# Patient Record
Sex: Female | Born: 1965 | Race: Black or African American | Hispanic: No | Smoking: Never smoker
Health system: Southern US, Community
[De-identification: ages and names within clinical notes are randomized; demographics above are authoritative.]

## PROBLEM LIST (undated history)

## (undated) DIAGNOSIS — I10 Essential (primary) hypertension: Secondary | ICD-10-CM

## (undated) HISTORY — PX: EYE SURGERY: SHX253

## (undated) HISTORY — PX: BREAST SURGERY: SHX581

## (undated) HISTORY — PX: ABLATION: SHX5711

---

## 2001-10-05 ENCOUNTER — Other Ambulatory Visit: Admission: RE | Admit: 2001-10-05 | Discharge: 2001-10-05 | Payer: Self-pay | Admitting: Obstetrics and Gynecology

## 2002-08-29 ENCOUNTER — Emergency Department (HOSPITAL_COMMUNITY): Admission: EM | Admit: 2002-08-29 | Discharge: 2002-08-29 | Payer: Self-pay | Admitting: *Deleted

## 2002-09-20 ENCOUNTER — Emergency Department (HOSPITAL_COMMUNITY): Admission: EM | Admit: 2002-09-20 | Discharge: 2002-09-20 | Payer: Self-pay | Admitting: Emergency Medicine

## 2002-09-20 ENCOUNTER — Ambulatory Visit (HOSPITAL_COMMUNITY): Admission: AD | Admit: 2002-09-20 | Discharge: 2002-09-20 | Payer: Self-pay | Admitting: Internal Medicine

## 2002-09-20 ENCOUNTER — Encounter: Payer: Self-pay | Admitting: Emergency Medicine

## 2002-10-18 ENCOUNTER — Ambulatory Visit (HOSPITAL_COMMUNITY): Admission: AD | Admit: 2002-10-18 | Discharge: 2002-10-18 | Payer: Self-pay | Admitting: Obstetrics and Gynecology

## 2002-11-03 ENCOUNTER — Inpatient Hospital Stay (HOSPITAL_COMMUNITY): Admission: AD | Admit: 2002-11-03 | Discharge: 2002-11-05 | Payer: Self-pay | Admitting: Obstetrics and Gynecology

## 2004-02-08 ENCOUNTER — Ambulatory Visit (HOSPITAL_COMMUNITY): Admission: RE | Admit: 2004-02-08 | Discharge: 2004-02-08 | Payer: Self-pay | Admitting: Family Medicine

## 2004-04-21 ENCOUNTER — Emergency Department (HOSPITAL_COMMUNITY): Admission: EM | Admit: 2004-04-21 | Discharge: 2004-04-22 | Payer: Self-pay | Admitting: Emergency Medicine

## 2004-05-04 ENCOUNTER — Ambulatory Visit (HOSPITAL_BASED_OUTPATIENT_CLINIC_OR_DEPARTMENT_OTHER): Admission: RE | Admit: 2004-05-04 | Discharge: 2004-05-04 | Payer: Self-pay | Admitting: General Surgery

## 2004-05-04 ENCOUNTER — Encounter: Admission: RE | Admit: 2004-05-04 | Discharge: 2004-05-04 | Payer: Self-pay | Admitting: General Surgery

## 2004-05-04 ENCOUNTER — Ambulatory Visit (HOSPITAL_COMMUNITY): Admission: RE | Admit: 2004-05-04 | Discharge: 2004-05-04 | Payer: Self-pay | Admitting: General Surgery

## 2004-05-04 ENCOUNTER — Encounter (INDEPENDENT_AMBULATORY_CARE_PROVIDER_SITE_OTHER): Payer: Self-pay | Admitting: Specialist

## 2005-10-18 ENCOUNTER — Ambulatory Visit (HOSPITAL_COMMUNITY): Admission: RE | Admit: 2005-10-18 | Discharge: 2005-10-18 | Payer: Self-pay | Admitting: Obstetrics and Gynecology

## 2005-10-21 ENCOUNTER — Emergency Department (HOSPITAL_COMMUNITY): Admission: EM | Admit: 2005-10-21 | Discharge: 2005-10-21 | Payer: Self-pay | Admitting: Emergency Medicine

## 2005-12-16 ENCOUNTER — Encounter: Payer: Self-pay | Admitting: Obstetrics and Gynecology

## 2005-12-16 ENCOUNTER — Ambulatory Visit (HOSPITAL_COMMUNITY): Admission: RE | Admit: 2005-12-16 | Discharge: 2005-12-16 | Payer: Self-pay | Admitting: Obstetrics and Gynecology

## 2006-03-20 ENCOUNTER — Ambulatory Visit (HOSPITAL_COMMUNITY): Admission: RE | Admit: 2006-03-20 | Discharge: 2006-03-20 | Payer: Self-pay | Admitting: Family Medicine

## 2007-01-07 ENCOUNTER — Ambulatory Visit (HOSPITAL_COMMUNITY): Admission: RE | Admit: 2007-01-07 | Discharge: 2007-01-07 | Payer: Self-pay | Admitting: Family Medicine

## 2007-03-09 ENCOUNTER — Ambulatory Visit (HOSPITAL_COMMUNITY): Admission: RE | Admit: 2007-03-09 | Discharge: 2007-03-09 | Payer: Self-pay | Admitting: Ophthalmology

## 2007-07-25 ENCOUNTER — Emergency Department (HOSPITAL_COMMUNITY): Admission: EM | Admit: 2007-07-25 | Discharge: 2007-07-25 | Payer: Self-pay | Admitting: Emergency Medicine

## 2008-06-17 ENCOUNTER — Other Ambulatory Visit: Admission: RE | Admit: 2008-06-17 | Discharge: 2008-06-17 | Payer: Self-pay | Admitting: Obstetrics and Gynecology

## 2009-05-05 ENCOUNTER — Ambulatory Visit (HOSPITAL_COMMUNITY): Admission: RE | Admit: 2009-05-05 | Discharge: 2009-05-05 | Payer: Self-pay | Admitting: Obstetrics and Gynecology

## 2009-05-16 ENCOUNTER — Encounter: Admission: RE | Admit: 2009-05-16 | Discharge: 2009-05-16 | Payer: Self-pay | Admitting: Obstetrics and Gynecology

## 2010-04-11 ENCOUNTER — Emergency Department (HOSPITAL_COMMUNITY): Admission: EM | Admit: 2010-04-11 | Discharge: 2010-04-11 | Payer: Self-pay | Admitting: Emergency Medicine

## 2010-07-13 ENCOUNTER — Encounter: Admission: RE | Admit: 2010-07-13 | Discharge: 2010-07-13 | Payer: Self-pay | Admitting: Obstetrics and Gynecology

## 2010-10-07 ENCOUNTER — Emergency Department (HOSPITAL_COMMUNITY)
Admission: EM | Admit: 2010-10-07 | Discharge: 2010-10-07 | Payer: Self-pay | Source: Home / Self Care | Admitting: Family Medicine

## 2010-11-24 ENCOUNTER — Emergency Department (HOSPITAL_COMMUNITY)
Admission: EM | Admit: 2010-11-24 | Discharge: 2010-11-24 | Payer: Self-pay | Source: Home / Self Care | Admitting: Emergency Medicine

## 2010-11-25 ENCOUNTER — Encounter: Payer: Self-pay | Admitting: Family Medicine

## 2011-01-21 LAB — CBC
HCT: 34.9 % — ABNORMAL LOW (ref 36.0–46.0)
Hemoglobin: 11.4 g/dL — ABNORMAL LOW (ref 12.0–15.0)
MCV: 85.8 fL (ref 78.0–100.0)
Platelets: 326 10*3/uL (ref 150–400)
RBC: 4.07 MIL/uL (ref 3.87–5.11)
RDW: 14.1 % (ref 11.5–15.5)
WBC: 12.5 10*3/uL — ABNORMAL HIGH (ref 4.0–10.5)

## 2011-01-21 LAB — DIFFERENTIAL
Lymphocytes Relative: 25 % (ref 12–46)
Monocytes Relative: 3 % (ref 3–12)
Neutro Abs: 8.9 10*3/uL — ABNORMAL HIGH (ref 1.7–7.7)
Neutrophils Relative %: 71 % (ref 43–77)

## 2011-01-21 LAB — BASIC METABOLIC PANEL
Creatinine, Ser: 0.77 mg/dL (ref 0.4–1.2)
Sodium: 138 mEq/L (ref 135–145)

## 2011-02-12 ENCOUNTER — Other Ambulatory Visit: Payer: Self-pay | Admitting: Obstetrics and Gynecology

## 2011-02-12 DIAGNOSIS — Z09 Encounter for follow-up examination after completed treatment for conditions other than malignant neoplasm: Secondary | ICD-10-CM

## 2011-02-22 ENCOUNTER — Ambulatory Visit
Admission: RE | Admit: 2011-02-22 | Discharge: 2011-02-22 | Disposition: A | Discharge status: Home / Self Care | Payer: BC Managed Care – PPO | Source: Ambulatory Visit | Attending: Obstetrics and Gynecology | Admitting: Obstetrics and Gynecology

## 2011-02-22 ENCOUNTER — Other Ambulatory Visit: Payer: Self-pay | Admitting: Obstetrics and Gynecology

## 2011-02-22 DIAGNOSIS — Z09 Encounter for follow-up examination after completed treatment for conditions other than malignant neoplasm: Secondary | ICD-10-CM

## 2011-03-22 NOTE — Op Note (Signed)
   [name], [name]                          ACCOUNT NO.:  [id number]   MEDICAL RECORD NO.:  [id number]                   PATIENT TYPE:  INP   LOCATION:  A428                                 FACILITY:  [facility]   PHYSICIAN:  Tilda Burrow, M.D.              DATE OF BIRTH:  1966-02-10   DATE OF PROCEDURE:  11/03/2002  DATE OF DISCHARGE:                                  PROCEDURE NOTE   ONSET OF LABOR:  November 03, 2002, at 5 a.m.   DATE OF DELIVERY:  November 03, 2002, at 1745 p.m.   LENGTH OF FIRST STAGE OF LABOR:  12 hours and 30 minutes.   LENGTH OF SECOND STAGE OF LABOR:  50 minutes.   LENGTH OF THIRD STAGE OF LABOR:  5 minutes.   DELIVERY NOTE:  The patient had a normal spontaneous vaginal delivery of a  viable female infant.  Upon delivery of the head, the nose and mouth were  suctioned.  The shoulders rapidly followed out without any difficulty.  Upon  delivery of the infant, the infant was active, alert, had good movement of  all extremities, strong cry, and pinked up well.  The infant was dried.  The  cord was clamped and cut.  The infant was placed on the mother's abdomen in  good condition.  Upon inspection, a three-vessel coronary disease was noted.  The perineum had a 1 degree laceration which required no suturing due to  good hemostasis.  The third stage of labor was actively managed with 20 cc  of Pitocin in 1000 cc of D5 LR at a rapid rate.  The placenta was delivered  spontaneously via Schultz's mechanism.  Upon inspection, the membranes were  noted to be intact.  The estimated blood loss was approximately 400 cc.  The  patient was given 1/2 of an ampule of Hemabate to promote fundal firmness  and hemostasis.     Zerita Boers, Reita Cliche, M.D.    DL/MEDQ  D:  24/40/1027  T:  11/03/2002  Job:  [id number]   cc:   [facility]

## 2011-03-22 NOTE — Op Note (Signed)
NAMEDERRIKA, RUFFALO              ACCOUNT NO.:  [id number]   MEDICAL RECORD NO.:  [id number]          PATIENT TYPE:  AMB   LOCATION:  DAY                           FACILITY:  [facility]   PHYSICIAN:  Tilda Burrow, M.D. DATE OF BIRTH:  09/30/1966   DATE OF PROCEDURE:  12/16/2005  DATE OF DISCHARGE:                                 OPERATIVE REPORT   PREOPERATIVE DIAGNOSES:  1.  Menorrhagia.  2.  Uterine fibroids.   POSTOPERATIVE DIAGNOSES:  1.  Menorrhagia.  2.  Uterine fibroids.   PROCEDURE:  Hysteroscopy, dilation and curettage, endometrial ablation.   SURGEON:  Tilda Burrow, M.D.   ASSISTANTAlinda Money, R.N.   ANESTHESIA:  General.   COMPLICATIONS:  None.   FINDINGS:  Uterus sounding to 11 cm preprocedure.   DETAILS OF PROCEDURE:  The patient was taken to the operating room for  vaginal procedure with speculum in place. Cervix was grasped with a single-  toothed tenaculum utilized and the uterine contours delineated. Hysteroscopy  showed a smooth internal cavity with some loose debris. Smooth, sharp  curettage removed the debris, and then we were able to [facility] ThermaChoice  endometrial ablation without worry. The patient tolerated the procedure  well.   ADDENDUM:  Paracervical block with Marcaine and _epinephrine___ was  completed.      Tilda Burrow, M.D.  Electronically Signed     JVF/MEDQ  D:  12/16/2005  T:  12/17/2005  Job:  [id number]

## 2011-03-22 NOTE — H&P (Signed)
[name], [name]              ACCOUNT NO.:  [id number]   MEDICAL RECORD NO.:  [id number]          PATIENT TYPE:  AMB   LOCATION:  DAY                           FACILITY:  [facility]   PHYSICIAN:  Tilda Burrow, M.D. DATE OF BIRTH:  05/19/1966   DATE OF ADMISSION:  DATE OF DISCHARGE:  LH                                HISTORY & PHYSICAL   ADMISSION DIAGNOSES:  1.  Menorrhagia.  2.  Uterine fibroids.  3.  Dysmenorrhea.   HISTORY:  This 45 year old large-framed African-American female was admitted  for hysteroscopy D&C and endometrial ablation. Her last menstrual period was  December 06, 2005, so she is on Day 10 of cycle. She has had a recent upper  respiratory infection. This is resolving as of December 09, 2005 office  visit. The patient has had a recent ultrasound which measured 11.5 x 7.5 x  8.2 cm uterine diameters with thickened endometrial stripe when she was in  gluteal phase. She had multiple small fibroids all less than 2 cm. Right  ovary was normal. Left ovary was normal. She is admitted at this time for  endometrial ablation, having seen both hysterectomy and endometrial ablation  educational pamphlets and been given the HTA endometrial ablation video  tape.   PAST MEDICAL HISTORY:  Positive for obesity and pregnancy-related  hypertension.   MEDICATIONS:  None at present.   PAST SURGICAL HISTORY:  1.  Tonsillectomy.  2.  Breast biopsy x2.   ALLERGIES:  None.   PHYSICAL EXAMINATION:  VITAL SIGNS:  Weight 275, blood pressure 118/60.  HEENT:  Pupils are equal, round, and reactive. Extraocular movements intact.  NECK:  Supple.  CHEST:  Clear to auscultation.  ABDOMEN:  Obese without masses.  EXTERNAL GENITALIA:  Multiparous. Good vaginal length. Well supported uterus  tissues. Cervix multiparous. GC and chlamydia culture negative. Pap smear  Class 04 October 2005. Adnexa nontender.   IMPRESSION:  Menorrhagia and uterine fibroids. Hysteroscopy D&C and  endometrial  ablation scheduled for December 16, 2005.      Tilda Burrow, M.D.  Electronically Signed     JVF/MEDQ  D:  12/09/2005  T:  12/09/2005  Job:  [id number]

## 2011-03-22 NOTE — Op Note (Signed)
[name], [name]                          ACCOUNT NO.:  [id number]   MEDICAL RECORD NO.:  [id number]                   PATIENT TYPE:  AMB   LOCATION:  DSC                                  FACILITY:  [facility]   PHYSICIAN:  Leonie Man, M.D.                DATE OF BIRTH:  1966/04/27   DATE OF PROCEDURE:  05/04/2004  DATE OF DISCHARGE:                                 OPERATIVE REPORT   PREOPERATIVE DIAGNOSIS:  Breast masses, right breast, rule out carcinoma.   POSTOPERATIVE DIAGNOSIS:  Breast masses, right breast, rule out carcinoma.   PROCEDURE:  Excisional biopsy of right breast masses following needle  localization.   SURGEON:  Leonie Man, M.D.   ASSISTANT:  Nurse.   ANESTHESIA:  General.   [name] is a 45 year old woman with a strong family of breast cancer,  who presents with a mammogram showing two breast masses.  These generally  look benign but because of her family history, excisional biopsies are  indicated.  The patient underwent needle localization before coming to the  operating room.   PROCEDURE:  Following the induction of satisfactory general anesthesia with  the patient positioned supinely, the right breast is prepped and draped to  be included in the sterile operative field.  The masses are located at the 2  o'clock axis and the 8 o'clock axis of the breast.  The mass in the 2  o'clock axis was first approached by a transverse incision made through the  skin and subcutaneous tissue, deepening it down through the skin and  subcutaneous tissues, localizing the needle, the wire.  The wire was then  followed down to the mass and the mass was excised in its entirety and  removed and forwarded for specimen mammography.  Next the lesion at the 8  o'clock axis was similarly approached by making a small elliptical incision,  this time around the wire, deepening this through the skin and subcutaneous  tissue and carrying the dissection down to the localizing  wire.  The tissues  here were excised and the mass that came up with the tissues looked grossly  like a fibroadenoma.  The mass was then forwarded for specimen mammography.  The wounds were inspected and hemostasis was obtained with electrocautery  and the breast tissue was reapproximated with interrupted 2-0 Vicryl  sutures.  Subcutaneous tissues were closed with interrupted 3-0 Vicryl  sutures, and the skin was closed with a 5-0 Monocryl suture in each of these  wounds.  The wounds were both reinforced with Steri-Strips and sterile  compressive dressings applied.  Anesthetic reversed, the patient removed  from the operating room to the recovery room in stable condition.  She  tolerated the procedure well.  Leonie Man, M.D.    PB/MEDQ  D:  05/04/2004  T:  05/05/2004  Job:  [id number]   cc:   Carilyn Goodpasture, M.D.  [facility]

## 2011-03-22 NOTE — H&P (Signed)
[name]              ACCOUNT NO.:  [id number]   MEDICAL RECORD NO.:  [id number]          PATIENT TYPE:  AMB   LOCATION:  DAY                           FACILITY:  [facility]   PHYSICIAN:  Tilda Burrow, M.D. DATE OF BIRTH:  Mar 23, 1966   DATE OF ADMISSION:  DATE OF DISCHARGE:  LH                                HISTORY & PHYSICAL   ADMITTING DIAGNOSES:  1.  Menorrhagia.  2.  Uterine fibroids.  3.  Scheduled for endometrial ablation.   HISTORY OF PRESENT ILLNESS:  This 45 year old female, gravida 3, para 2 is  admitted at this time for endometrial ablation.  [name] has been having  unacceptable heavy menses.  She has been evaluated initially by Dr. Kate Sable  and subsequently by [place].  Her last delivery was December, 2004.  She had an  ultrasound which reveals multiple small uterine fibroids, none greater than  2 cm in size.  She has a GC culture negative, chlamydia culture negative,  pap smear class I.  She desires to avoid hysterectomy.  Contraception has  been handled by her husband who has a vasectomy.  She desires endometrial  ablation.  The procedure has been reviewed with HTA endometrial ablation  video tape reviewed as well as brochures.  The questions have been answered  to the patient's satisfaction.   PAST MEDICAL HISTORY:  1.  Positive obesity.  2.  Mild hypertension, not currently on medical therapy.   SURGICAL HISTORY:  1.  Tonsillectomy.  2.  Right breast biopsy x2.   PHYSICAL EXAMINATION:  GENERAL: A large-framed African-American female, alert.  VITAL SIGNS: Weight 275, blood pressure 118/60.  HEENT: Pupils are equal, round, reactive.  Extraocular movements are intact.  NECK: Supple.  CHEST: Clear to auscultation.  ABDOMEN: Significant obesity.  PELVIC: External genitalia: Multiparous vaginal exam.  Normal secretions.  GC and chlamydia negative.  Pap smear class I.  Uterus anterior.  Difficult  to feel, limited exam, limited by patient girth but  confirmed by ultrasound.   PLAN:  Hysteroscopy, D&C, endometrial ablation on December 16, 2005.      Tilda Burrow, M.D.  Electronically Signed     JVF/MEDQ  D:  12/16/2005  T:  12/16/2005  Job:  [id number]   cc:   [facility]

## 2011-03-22 NOTE — H&P (Signed)
   [name], [name]                          ACCOUNT NO.:  [id number]   MEDICAL RECORD NO.:  [id number]                   PATIENT TYPE:  INP   LOCATION:  A428                                 FACILITY:  [facility]   PHYSICIAN:  Tilda Burrow, M.D.              DATE OF BIRTH:  Mar 11, 1966   DATE OF ADMISSION:  11/03/2002  DATE OF DISCHARGE:                                HISTORY & PHYSICAL   REASON FOR ADMISSION:  Pregnancy at 40 weeks and 1 day in early labor.   HISTORY OF PRESENT ILLNESS:  The patient has been having irregular uterine  contractions for the past two to three days.  She presents to the office  this morning with cervical change from the day before.  On November 01, 2002, the cervix was 2 cm, 50% effaced, and -1 station.  Today the cervix is  3 cm, 70% effaced, and -1 station.  She is having contractions.  She is  going to labor and delivery.   PAST MEDICAL HISTORY:  Negative.   PAST SURGICAL HISTORY:  Positive for tonsils.   ALLERGIES:  She has no known allergies.   LABORATORY DATA:  GBS is positive.  She will be treated.  Blood type is A+.  UDS was negative.  Rubella is immune.  Hepatitis B surface antigen negative.  HIV is negative.  Serologies nonreactive.  GC and chlamydia are both  negative.  The 28-week hemoglobin was 10.6 with a hematocrit of 34.3.  The  one-hour glucose was 97.  The sickle cell screen was negative.   PRENATAL COURSE:  Essentially uneventful without complications.   PHYSICAL EXAMINATION:  WEIGHT:  272 pounds.  VITAL SIGNS:  Blood pressure 120/80.  ABDOMEN:  The fundal height is 39 cm.  The fetal heart rate is 130, strong,  and regular.  PELVIC:  The cervix is 3 cm, 70% effaced, and -1 station.   PLAN:  1. We are going to admit.  2. Pitocin augmentation for her labor.     Zerita Boers, Reita Cliche, M.D.    DL/MEDQ  D:  40/98/1191  T:  11/03/2002  Job:  [id number]   cc:   [facility]

## 2011-07-15 ENCOUNTER — Other Ambulatory Visit: Payer: BC Managed Care – PPO

## 2011-07-19 ENCOUNTER — Other Ambulatory Visit (HOSPITAL_COMMUNITY): Payer: Self-pay | Admitting: Family Medicine

## 2011-07-19 ENCOUNTER — Ambulatory Visit (HOSPITAL_COMMUNITY)
Admission: RE | Admit: 2011-07-19 | Discharge: 2011-07-19 | Disposition: A | Discharge status: Home / Self Care | Payer: BC Managed Care – PPO | Source: Ambulatory Visit | Attending: Family Medicine | Admitting: Family Medicine

## 2011-07-19 DIAGNOSIS — M25529 Pain in unspecified elbow: Secondary | ICD-10-CM | POA: Insufficient documentation

## 2011-07-19 DIAGNOSIS — M25569 Pain in unspecified knee: Secondary | ICD-10-CM | POA: Insufficient documentation

## 2011-07-19 DIAGNOSIS — M25519 Pain in unspecified shoulder: Secondary | ICD-10-CM

## 2011-07-31 ENCOUNTER — Ambulatory Visit
Admission: RE | Admit: 2011-07-31 | Discharge: 2011-07-31 | Disposition: A | Discharge status: Home / Self Care | Payer: BC Managed Care – PPO | Source: Ambulatory Visit | Attending: Obstetrics and Gynecology | Admitting: Obstetrics and Gynecology

## 2011-07-31 DIAGNOSIS — Z09 Encounter for follow-up examination after completed treatment for conditions other than malignant neoplasm: Secondary | ICD-10-CM

## 2011-08-07 ENCOUNTER — Emergency Department: Payer: Self-pay | Admitting: Emergency Medicine

## 2011-08-15 LAB — COMPREHENSIVE METABOLIC PANEL WITH GFR
ALT: 11
AST: 14
Albumin: 3 — ABNORMAL LOW
Alkaline Phosphatase: 52
BUN: 6
CO2: 28
Calcium: 8.7
Chloride: 105
Creatinine, Ser: 0.79
GFR calc non Af Amer: >60
Glucose, Bld: 101 — ABNORMAL HIGH
Potassium: 3.7
Sodium: 139
Total Bilirubin: 0.4
Total Protein: 6.2

## 2011-08-15 LAB — DIFFERENTIAL
Basophils Absolute: 0
Basophils Relative: 0
Eosinophils Absolute: 0.1
Eosinophils Relative: 1
Lymphocytes Relative: 13
Lymphs Abs: 1.4
Monocytes Absolute: 0.3
Monocytes Relative: 3
Neutro Abs: 9 — ABNORMAL HIGH
Neutrophils Relative %: 83 — ABNORMAL HIGH

## 2011-08-15 LAB — CBC
HCT: 33.1 — ABNORMAL LOW
MCV: 82.4
Platelets: 280
RDW: 14.4 — ABNORMAL HIGH

## 2011-08-15 LAB — URINALYSIS, ROUTINE W REFLEX MICROSCOPIC
Bilirubin Urine: NEGATIVE
Glucose, UA: NEGATIVE
Hgb urine dipstick: NEGATIVE
Ketones, ur: NEGATIVE
Nitrite: NEGATIVE
Protein, ur: NEGATIVE
Specific Gravity, Urine: 1.025
Urobilinogen, UA: 0.2
pH: 6

## 2011-08-15 LAB — URINE MICROSCOPIC-ADD ON

## 2012-06-10 ENCOUNTER — Emergency Department (HOSPITAL_COMMUNITY)
Admission: EM | Admit: 2012-06-10 | Discharge: 2012-06-10 | Disposition: A | Discharge status: Home / Self Care | Payer: No Typology Code available for payment source | Attending: Emergency Medicine | Admitting: Emergency Medicine

## 2012-06-10 ENCOUNTER — Encounter (HOSPITAL_COMMUNITY): Payer: Self-pay | Admitting: Emergency Medicine

## 2012-06-10 DIAGNOSIS — M5380 Other specified dorsopathies, site unspecified: Secondary | ICD-10-CM | POA: Insufficient documentation

## 2012-06-10 DIAGNOSIS — M545 Low back pain, unspecified: Secondary | ICD-10-CM | POA: Insufficient documentation

## 2012-06-10 DIAGNOSIS — R0789 Other chest pain: Secondary | ICD-10-CM | POA: Insufficient documentation

## 2012-06-10 DIAGNOSIS — M62830 Muscle spasm of back: Secondary | ICD-10-CM

## 2012-06-10 MED ORDER — HYDROCODONE-ACETAMINOPHEN 7.5-325 MG PO TABS: 1.0000 | ORAL_TABLET | ORAL | Status: AC | PRN

## 2012-06-10 MED ORDER — METHOCARBAMOL 500 MG PO TABS: ORAL_TABLET | ORAL | Status: DC
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
## 2012-06-10 NOTE — ED Notes (Signed)
Called once for room placement.  No response.  

## 2012-06-10 NOTE — ED Notes (Signed)
Pt states when she is walking around at home her O2 drops to 82-83, pt states when she is sitting it sometimes drops to 87. Pt has been congested, cough, productive and white. Dr. Sherwood Gambler attempted to adjust O2 but is no better. Pt is currently back on 3.5L which is her normal setting
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
## 2012-06-11 NOTE — ED Provider Notes (Signed)
Medical screening examination/treatment/procedure(s) were performed by non-physician practitioner and as supervising physician I was immediately available for consultation/collaboration.   Charnika Herbst M Irini Leet, DO 06/11/12 0828 

## 2012-06-13 IMAGING — CR DG SHOULDER 2+V*L*
3 series · 3 of 3 positions shown · non-contrast
Comparison: None.

CLINICAL DATA: Left shoulder and upper arm pain.

LEFT SHOULDER - 2+ VIEW

[view not recorded (1 of 3)]
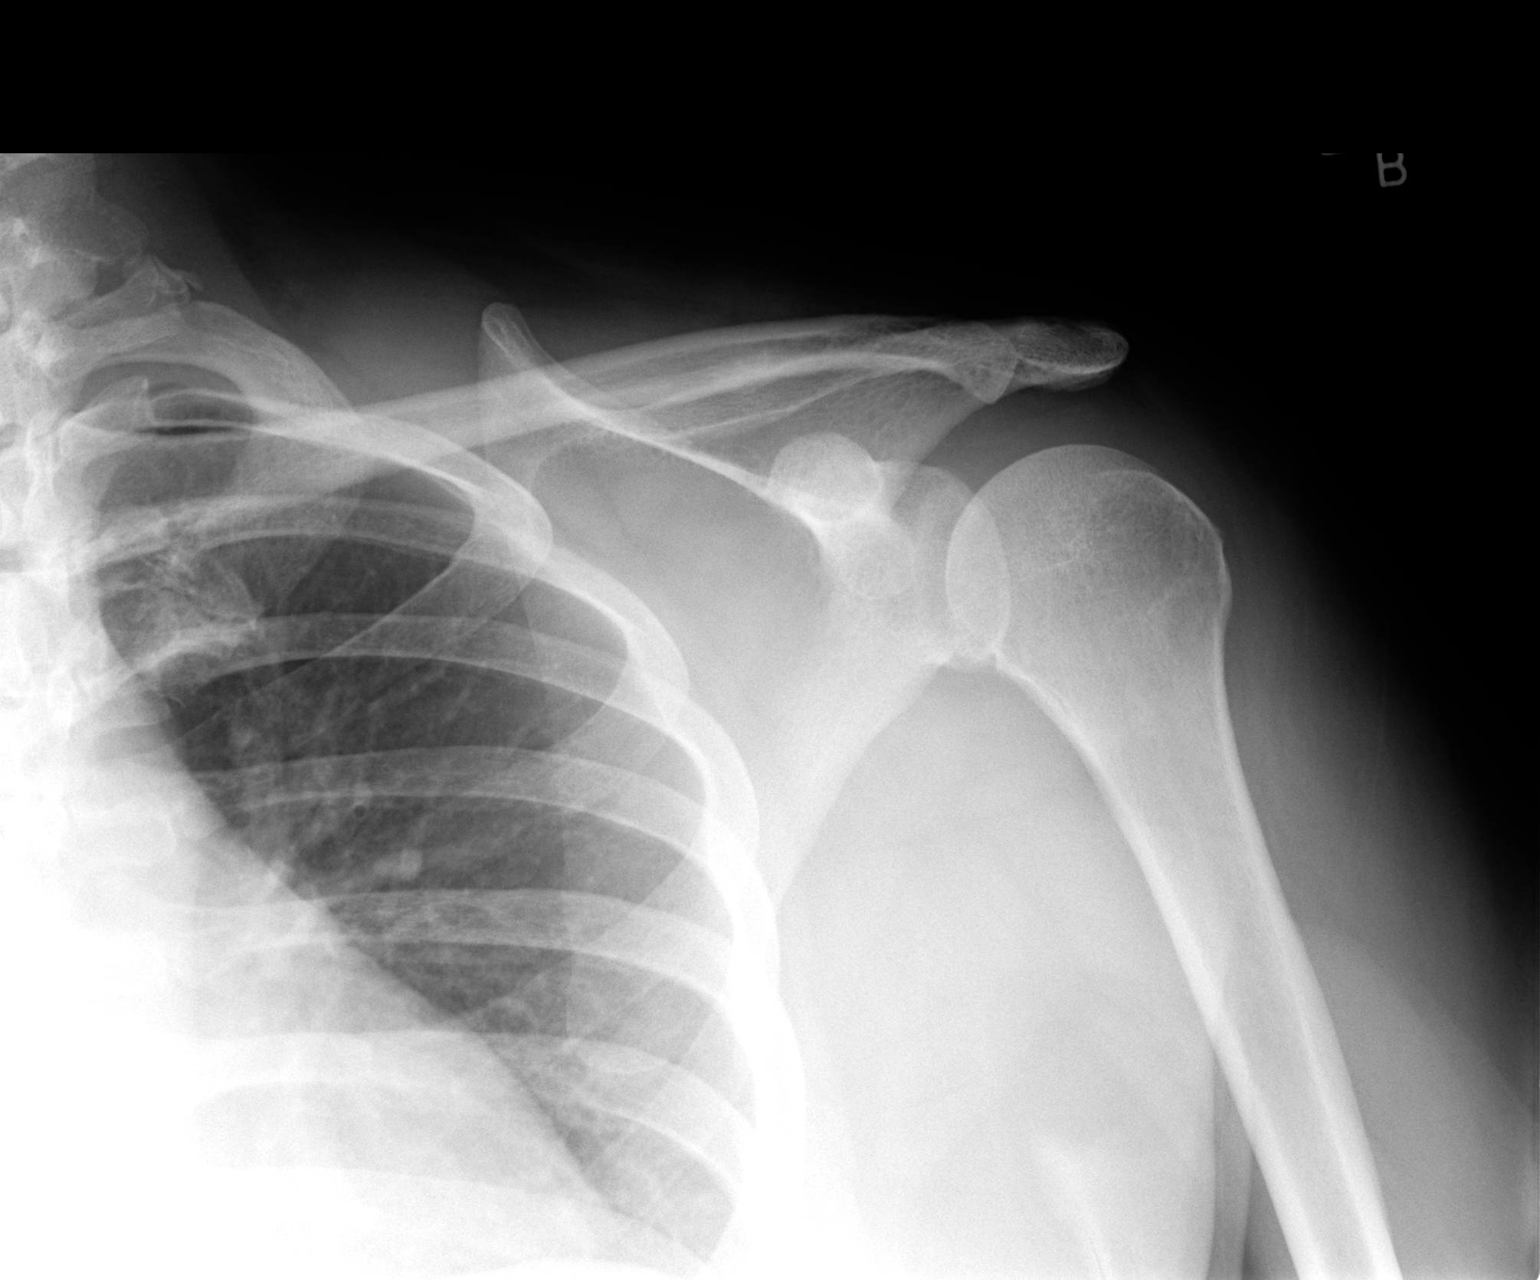

[view not recorded (2 of 3)]
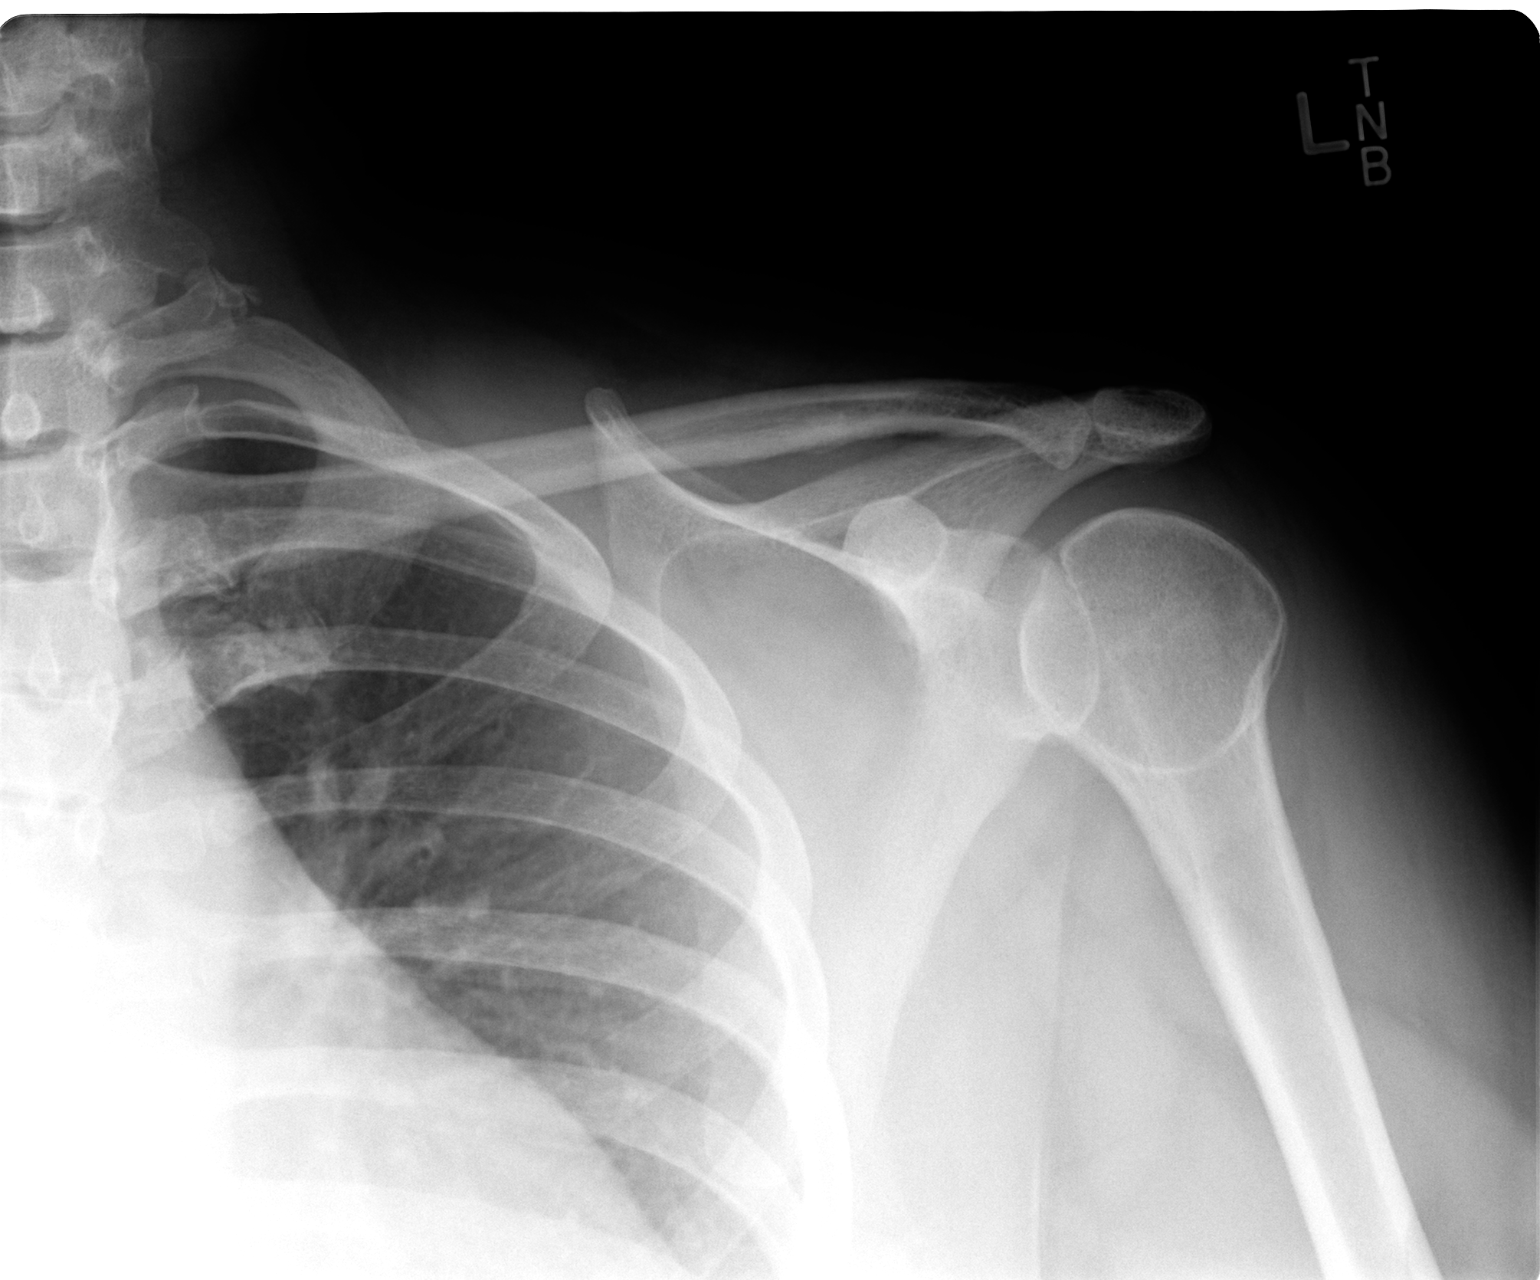

[view not recorded (3 of 3)]
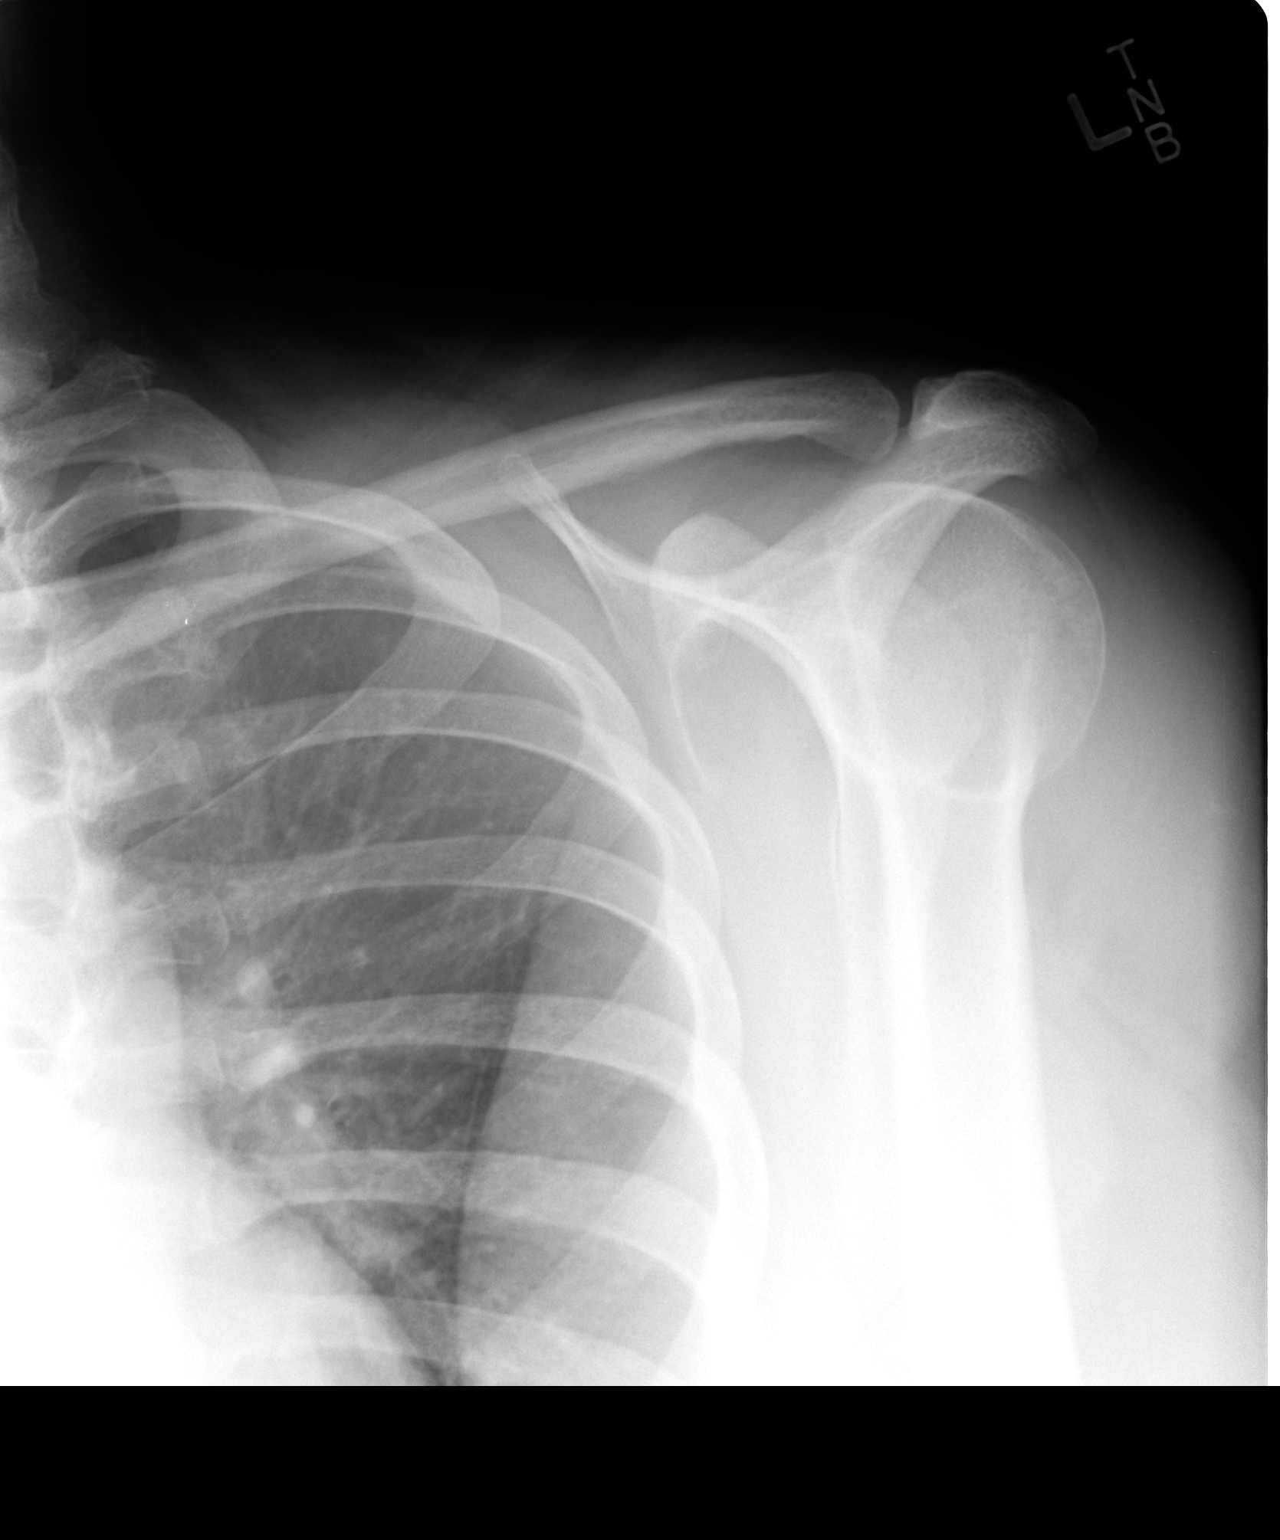

[3 of 3 positions shown; findings below may reference images not displayed]

FINDINGS: The mineralization and alignment are normal.  There is no
evidence of acute fracture or dislocation.  The subacromial space
is preserved.  There are mild acromioclavicular degenerative
changes.
IMPRESSION: No acute osseous findings.  Mild acromioclavicular degenerative
changes.

## 2014-06-01 ENCOUNTER — Other Ambulatory Visit: Payer: Self-pay | Admitting: Obstetrics and Gynecology

## 2014-06-01 DIAGNOSIS — N6310 Unspecified lump in the right breast, unspecified quadrant: Secondary | ICD-10-CM

## 2014-06-08 ENCOUNTER — Ambulatory Visit
Admission: RE | Admit: 2014-06-08 | Discharge: 2014-06-08 | Disposition: A | Discharge status: Home / Self Care | Payer: BC Managed Care – PPO | Source: Ambulatory Visit | Attending: Obstetrics and Gynecology | Admitting: Obstetrics and Gynecology

## 2014-06-08 ENCOUNTER — Encounter (INDEPENDENT_AMBULATORY_CARE_PROVIDER_SITE_OTHER): Payer: Self-pay

## 2014-06-08 DIAGNOSIS — N6310 Unspecified lump in the right breast, unspecified quadrant: Secondary | ICD-10-CM

## 2014-10-31 ENCOUNTER — Emergency Department (HOSPITAL_COMMUNITY)
Admission: EM | Admit: 2014-10-31 | Discharge: 2014-10-31 | Disposition: A | Discharge status: Home / Self Care | Payer: BC Managed Care – PPO | Attending: Emergency Medicine | Admitting: Emergency Medicine

## 2014-10-31 ENCOUNTER — Encounter (HOSPITAL_COMMUNITY): Payer: Self-pay | Admitting: *Deleted

## 2014-10-31 DIAGNOSIS — I10 Essential (primary) hypertension: Secondary | ICD-10-CM | POA: Diagnosis not present

## 2014-10-31 DIAGNOSIS — M436 Torticollis: Secondary | ICD-10-CM | POA: Diagnosis not present

## 2014-10-31 DIAGNOSIS — Z791 Long term (current) use of non-steroidal anti-inflammatories (NSAID): Secondary | ICD-10-CM | POA: Diagnosis not present

## 2014-10-31 DIAGNOSIS — Z79899 Other long term (current) drug therapy: Secondary | ICD-10-CM | POA: Diagnosis not present

## 2014-10-31 DIAGNOSIS — R51 Headache: Secondary | ICD-10-CM | POA: Diagnosis present

## 2014-10-31 HISTORY — DX: Essential (primary) hypertension: I10

## 2014-10-31 MED ORDER — HYDROCODONE-ACETAMINOPHEN 5-325 MG PO TABS
1.0000 | ORAL_TABLET | ORAL | Status: DC | PRN
Start: 2014-10-31 — End: 2014-10-31

## 2014-10-31 MED ORDER — MELOXICAM 15 MG PO TABS: 15.0000 mg | ORAL_TABLET | Freq: Every day | ORAL | Status: DC

## 2014-10-31 MED ORDER — METHOCARBAMOL 500 MG PO TABS: 1000.0000 mg | ORAL_TABLET | Freq: Three times a day (TID) | ORAL | Status: DC

## 2014-10-31 MED ORDER — HYDROCODONE-ACETAMINOPHEN 5-325 MG PO TABS: ORAL_TABLET | ORAL | Status: DC

## 2014-10-31 MED ORDER — KETOROLAC TROMETHAMINE 10 MG PO TABS
10.0000 mg | ORAL_TABLET | Freq: Once | ORAL | Status: AC
  Administered 2014-10-31: 10 mg via ORAL
  Filled 2014-10-31: qty 1

## 2014-10-31 MED FILL — Hydrocodone-Acetaminophen Tab 5-325 MG: ORAL | Qty: 6 | Status: AC

## 2014-10-31 NOTE — Discharge Instructions (Signed)
Heating pad to the neck and shoulder may be helpful. Norco and robaxin may cause drowsiness, use with caution. Torticollis, Acute You have suddenly (acutely) developed a twisted neck (torticollis). This is usually a self-limited condition. CAUSES  Acute torticollis may be caused by malposition, trauma or infection. Most commonly, acute torticollis is caused by sleeping in an awkward position. Torticollis may also be caused by the flexion, extension or twisting of the neck muscles beyond their normal position. Sometimes, the exact cause may not be known. SYMPTOMS  Usually, there is pain and limited movement of the neck. Your neck may twist to one side. DIAGNOSIS  The diagnosis is often made by physical examination. X-rays, CT scans or MRIs may be done if there is a history of trauma or concern of infection. TREATMENT  For a common, stiff neck that develops during sleep, treatment is focused on relaxing the contracted neck muscle. Medications (including shots) may be used to treat the problem. Most cases resolve in several days. Torticollis usually responds to conservative physical therapy. If left untreated, the shortened and spastic neck muscle can cause deformities in the face and neck. Rarely, surgery is required. HOME CARE INSTRUCTIONS   Use over-the-counter and prescription medications as directed by your caregiver.  Do stretching exercises and massage the neck as directed by your caregiver.  Follow up with physical therapy if needed and as directed by your caregiver. SEEK IMMEDIATE MEDICAL CARE IF:   You develop difficulty breathing or noisy breathing (stridor).  You drool, develop trouble swallowing or have pain with swallowing.  You develop numbness or weakness in the hands or feet.  You have changes in speech or vision.  You have problems with urination or bowel movements.  You have difficulty walking.  You have a fever.  You have increased pain. MAKE SURE YOU:    Understand these instructions.  Will watch your condition.  Will get help right away if you are not doing well or get worse. Document Released: 10/18/2000 Document Revised: 01/13/2012 Document Reviewed: 11/29/2009 [facility] Patient Information 2015 [place], [place]. This information is not intended to replace advice given to you by your health care provider. Make sure you discuss any questions you have with your health care provider.

## 2014-10-31 NOTE — ED Notes (Signed)
Pt alert & oriented x4, stable gait. Patient given discharge instructions, paperwork & prescription(s). Patient  instructed to stop at the registration desk to finish any additional paperwork. Patient verbalized understanding. Pt left department w/ no further questions. 

## 2014-10-31 NOTE — ED Provider Notes (Signed)
CSN: [id number]     Arrival date & time 10/31/14  0006 History   First MD Initiated Contact with Patient 10/31/14 0110     Chief Complaint  Patient presents with  . Headache  . Torticollis     (Consider location/radiation/quality/duration/timing/severity/associated sxs/prior Treatment) HPI Comments: Patient is a 48 year old female who presents to the emergency department with complaint of neck pain, headache, and shoulder pain. The patient states that earlier today she began to notice some stiffness of her neck on on the left side. She then developed headache that went to the back of her head, and later than that notice pain that moved from the back of her head into the left upper shoulder. She tried Aleve and ibuprofen but this did not help. She became quite concerned about this and then noticed that she had some tingling in her fingers. At this point she thought that it was wise to come to the emergency department for evaluation. She denies any chest pain, shortness of breath, vision changes, loss of consciousness, excessive nausea or vomiting. Movement of the neck and shoulder causes increase in the pain. She states there is nothing that seems to make it any better.  Patient is a 48 y.o. female presenting with headaches. The history is provided by the patient.  Headache Associated symptoms: neck pain and neck stiffness   Associated symptoms: no abdominal pain, no back pain, no cough, no dizziness, no photophobia and no seizures     Past Medical History  Diagnosis Date  . Hypertension    Past Surgical History  Procedure Laterality Date  . Ablation    . Eye surgery    . Breast surgery      lump removed   History reviewed. No pertinent family history. History  Substance Use Topics  . Smoking status: Never Smoker   . Smokeless tobacco: Not on file  . Alcohol Use: No   OB History    No data available     Review of Systems  Constitutional: Negative for activity change.   All ROS Neg except as noted in HPI  HENT: Negative for nosebleeds.   Eyes: Negative for photophobia and discharge.  Respiratory: Negative for cough, shortness of breath and wheezing.   Cardiovascular: Negative for chest pain and palpitations.  Gastrointestinal: Negative for abdominal pain and blood in stool.  Genitourinary: Negative for dysuria, frequency and hematuria.  Musculoskeletal: Positive for neck pain and neck stiffness. Negative for back pain and arthralgias.  Skin: Negative.   Neurological: Positive for headaches. Negative for dizziness, seizures and speech difficulty.  Psychiatric/Behavioral: Negative for hallucinations and confusion.      Allergies  Tetracyclines & related and Zithromax  Home Medications   Prior to Admission medications   Medication Sig Start Date End Date Taking? Authorizing Provider  fish oil-omega-3 fatty acids 1000 MG capsule Take 1 g by mouth daily.   Yes Historical Provider, MD  losartan-hydrochlorothiazide (HYZAAR) 100-25 MG per tablet Take 1 tablet by mouth daily.   Yes Historical Provider, MD  Multiple Vitamin (MULTIVITAMIN WITH MINERALS) TABS Take 1 tablet by mouth daily.   Yes Historical Provider, MD  ferrous sulfate 325 (65 FE) MG tablet Take 325 mg by mouth daily with breakfast.    Historical Provider, MD  HYDROcodone-acetaminophen (NORCO/VICODIN) 5-325 MG per tablet 1 or 2 po q4h prn pain 10/31/14   Lenox Ahr, PA-C  meloxicam (MOBIC) 15 MG tablet Take 1 tablet (15 mg total) by mouth daily. 10/31/14   Meryl Crutch  Pryor Montes, PA-C  methocarbamol (ROBAXIN) 500 MG tablet Take 2 tablets (1,000 mg total) by mouth 3 (three) times daily. 10/31/14   Lenox Ahr, PA-C   BP 121/71 mmHg  Pulse 67  Temp(Src) 98.3 F (36.8 C) (Oral)  Resp 18  Ht 5\' 6"  (1.676 m)  Wt 315 lb (142.883 kg)  BMI 50.87 kg/m2  SpO2 100% Physical Exam  Constitutional: She is oriented to person, place, and time. She appears well-developed and well-nourished.  Non-toxic  appearance.  HENT:  Head: Normocephalic.  Right Ear: Tympanic membrane and external ear normal.  Left Ear: Tympanic membrane and external ear normal.  Eyes: EOM and lids are normal. Pupils are equal, round, and reactive to light.  Neck: Normal range of motion. Neck supple. Carotid bruit is not present. No tracheal deviation present.  No carotid bruit appreciated.  Range of motion of the neck is limited, due to discomfort.  Cardiovascular: Normal rate, regular rhythm, normal heart sounds, intact distal pulses and normal pulses.   Pulmonary/Chest: Breath sounds normal. No respiratory distress.  Abdominal: Soft. Bowel sounds are normal. There is no tenderness. There is no guarding.  Musculoskeletal: Normal range of motion.  There is pain and tenseness involved involving the left upper trapezius extending into the lower portion of the neck.  There is no palpable step off of the cervical spine area.  Lymphadenopathy:       Head (right side): No submandibular adenopathy present.       Head (left side): No submandibular adenopathy present.    She has no cervical adenopathy.  Neurological: She is alert and oriented to person, place, and time. She has normal strength. No cranial nerve deficit or sensory deficit. She exhibits normal muscle tone. Coordination normal.  Grip is symmetrical. There is no muscle atrophy noted of the right or left upper extremity. Flexion and extension of the upper extremities is symmetrical.  Gait is steady, and there is no evidence of any facial weakness.  Skin: Skin is warm and dry.  Psychiatric: She has a normal mood and affect. Her speech is normal.  Nursing note and vitals reviewed.   ED Course  Procedures (including critical care time) Labs Review Labs Reviewed - No data to display  Imaging Review No results found.   EKG Interpretation None      MDM  Vital signs are well within normal limits. Pulse oximetry is 100% on room air. Within normal limits  by my interpretation.  Examination is consistent with torticollis. I discussed with the patient that this could possibly be tension induced.  The plan at this time is for the patient to be treated with Norco, mobile, and Robaxin. Patient is advised to apply heat to the area. Patient is to see her primary physician, or return to the emergency department if any changes, problems, or concerns.    Final diagnoses:  Torticollis    **I have reviewed nursing notes, vital signs, and all appropriate lab and imaging results for this patient.Lenox Ahr, PA-C 10/31/14 0121  Ephraim Hamburger, MD 11/03/14 [phone]

## 2014-10-31 NOTE — ED Notes (Signed)
Pt states she has had a headache to the back of her head & now having some neck stiffness. denies any new activity or injury. Pt took ibuprofen around 2010.

## 2019-02-11 DIAGNOSIS — Z1151 Encounter for screening for human papillomavirus (HPV): Secondary | ICD-10-CM | POA: Diagnosis not present

## 2019-02-11 DIAGNOSIS — Z124 Encounter for screening for malignant neoplasm of cervix: Secondary | ICD-10-CM | POA: Diagnosis not present

## 2019-02-11 DIAGNOSIS — Z6841 Body Mass Index (BMI) 40.0 and over, adult: Secondary | ICD-10-CM | POA: Diagnosis not present

## 2019-02-11 DIAGNOSIS — R232 Flushing: Secondary | ICD-10-CM | POA: Diagnosis not present

## 2019-02-11 DIAGNOSIS — Z01419 Encounter for gynecological examination (general) (routine) without abnormal findings: Secondary | ICD-10-CM | POA: Diagnosis not present

## 2019-02-11 DIAGNOSIS — Z113 Encounter for screening for infections with a predominantly sexual mode of transmission: Secondary | ICD-10-CM | POA: Diagnosis not present

## 2019-02-11 DIAGNOSIS — N898 Other specified noninflammatory disorders of vagina: Secondary | ICD-10-CM | POA: Diagnosis not present

## 2019-02-26 DIAGNOSIS — Z1212 Encounter for screening for malignant neoplasm of rectum: Secondary | ICD-10-CM | POA: Diagnosis not present

## 2019-02-26 DIAGNOSIS — Z1211 Encounter for screening for malignant neoplasm of colon: Secondary | ICD-10-CM | POA: Diagnosis not present

## 2019-03-24 DIAGNOSIS — Z1231 Encounter for screening mammogram for malignant neoplasm of breast: Secondary | ICD-10-CM | POA: Diagnosis not present

## 2019-04-28 DIAGNOSIS — I10 Essential (primary) hypertension: Secondary | ICD-10-CM | POA: Diagnosis not present

## 2019-04-28 DIAGNOSIS — Z6841 Body Mass Index (BMI) 40.0 and over, adult: Secondary | ICD-10-CM | POA: Diagnosis not present

## 2019-04-28 DIAGNOSIS — R7309 Other abnormal glucose: Secondary | ICD-10-CM | POA: Diagnosis not present

## 2019-04-28 DIAGNOSIS — Z1389 Encounter for screening for other disorder: Secondary | ICD-10-CM | POA: Diagnosis not present

## 2019-04-28 DIAGNOSIS — E7849 Other hyperlipidemia: Secondary | ICD-10-CM | POA: Diagnosis not present

## 2019-11-19 DIAGNOSIS — J069 Acute upper respiratory infection, unspecified: Secondary | ICD-10-CM | POA: Diagnosis not present

## 2019-11-23 ENCOUNTER — Other Ambulatory Visit: Payer: Self-pay

## 2019-11-23 ENCOUNTER — Ambulatory Visit: Payer: Self-pay | Attending: Internal Medicine

## 2019-11-23 DIAGNOSIS — U071 COVID-19: Secondary | ICD-10-CM | POA: Insufficient documentation

## 2019-11-23 DIAGNOSIS — Z20822 Contact with and (suspected) exposure to covid-19: Secondary | ICD-10-CM

## 2019-11-25 LAB — NOVEL CORONAVIRUS, NAA: SARS-CoV-2, NAA: DETECTED — AB

## 2019-12-06 ENCOUNTER — Telehealth: Payer: Self-pay | Admitting: *Deleted

## 2019-12-06 NOTE — Telephone Encounter (Signed)
Dr Helene Kelp from [facility] ,called for patient's contacts numbers

## 2021-12-03 ENCOUNTER — Other Ambulatory Visit: Payer: Self-pay | Admitting: Gastroenterology

## 2022-01-10 ENCOUNTER — Encounter (HOSPITAL_COMMUNITY): Payer: Self-pay | Admitting: Gastroenterology

## 2022-01-17 NOTE — Anesthesia Preprocedure Evaluation (Addendum)
Anesthesia Evaluation  ?Patient identified by MRN, date of birth, ID band ?Patient awake ? ? ? ?Reviewed: ?Allergy & Precautions, NPO status , Patient's Chart, lab work & pertinent test results ? ?Airway ?Mallampati: II ? ?TM Distance: >3 FB ?Neck ROM: Full ? ? ? Dental ?no notable dental hx. ?(+) Teeth Intact, Dental Advisory Given ?  ?Pulmonary ?neg pulmonary ROS,  ?  ?Pulmonary exam normal ?breath sounds clear to auscultation ? ? ? ? ? ? Cardiovascular ?hypertension, Pt. on medications ?Normal cardiovascular exam ?Rhythm:Regular Rate:Normal ? ? ?  ?Neuro/Psych ?negative neurological ROS ? negative psych ROS  ? GI/Hepatic ?negative GI ROS, Neg liver ROS,   ?Endo/Other  ?Morbid obesity (BMI 54.1) ? Renal/GU ?negative Renal ROS  ? ?  ?Musculoskeletal ?negative musculoskeletal ROS ?(+)  ? Abdominal ?(+) + obese,   ?Peds ? Hematology ?  ?Anesthesia Other Findings ? ? Reproductive/Obstetrics ? ?  ? ? ? ? ? ? ? ? ? ? ? ? ? ?  ?  ? ? ? ? ? ? ? ?Anesthesia Physical ?Anesthesia Plan ? ?ASA: 3 ? ?Anesthesia Plan: MAC  ? ?Post-op Pain Management:   ? ?Induction: Intravenous ? ?PONV Risk Score and Plan: Treatment may vary due to age or medical condition ? ?Airway Management Planned: Natural Airway and Nasal Cannula ? ?Additional Equipment: None ? ?Intra-op Plan:  ? ?Post-operative Plan:  ? ?Informed Consent: I have reviewed the patients History and Physical, chart, labs and discussed the procedure including the risks, benefits and alternatives for the proposed anesthesia with the patient or authorized representative who has indicated his/her understanding and acceptance.  ? ? ? ?Dental advisory given ? ?Plan Discussed with: CRNA and Anesthesiologist ? ?Anesthesia Plan Comments:   ? ? ? ? ? ?Anesthesia Quick Evaluation ? ?

## 2022-01-18 ENCOUNTER — Encounter (HOSPITAL_COMMUNITY)
Admission: RE | Disposition: A | Discharge status: Home / Self Care | Payer: Self-pay | Source: Home / Self Care | Attending: Gastroenterology

## 2022-01-18 ENCOUNTER — Encounter (HOSPITAL_COMMUNITY): Payer: Self-pay | Admitting: Gastroenterology

## 2022-01-18 ENCOUNTER — Ambulatory Visit (HOSPITAL_COMMUNITY)
Admission: RE | Admit: 2022-01-18 | Discharge: 2022-01-18 | Disposition: A | Discharge status: Home / Self Care | Payer: 59 | Attending: Gastroenterology | Admitting: Gastroenterology

## 2022-01-18 ENCOUNTER — Ambulatory Visit (HOSPITAL_BASED_OUTPATIENT_CLINIC_OR_DEPARTMENT_OTHER): Payer: 59 | Admitting: Anesthesiology

## 2022-01-18 ENCOUNTER — Other Ambulatory Visit: Payer: Self-pay

## 2022-01-18 ENCOUNTER — Ambulatory Visit (HOSPITAL_COMMUNITY): Payer: 59 | Admitting: Anesthesiology

## 2022-01-18 DIAGNOSIS — I10 Essential (primary) hypertension: Secondary | ICD-10-CM | POA: Diagnosis not present

## 2022-01-18 DIAGNOSIS — Z79899 Other long term (current) drug therapy: Secondary | ICD-10-CM | POA: Insufficient documentation

## 2022-01-18 DIAGNOSIS — K635 Polyp of colon: Secondary | ICD-10-CM | POA: Diagnosis not present

## 2022-01-18 DIAGNOSIS — D122 Benign neoplasm of ascending colon: Secondary | ICD-10-CM | POA: Diagnosis not present

## 2022-01-18 DIAGNOSIS — D120 Benign neoplasm of cecum: Secondary | ICD-10-CM | POA: Diagnosis not present

## 2022-01-18 DIAGNOSIS — Z1211 Encounter for screening for malignant neoplasm of colon: Secondary | ICD-10-CM

## 2022-01-18 DIAGNOSIS — Z6841 Body Mass Index (BMI) 40.0 and over, adult: Secondary | ICD-10-CM | POA: Diagnosis not present

## 2022-01-18 HISTORY — PX: POLYPECTOMY: SHX5525

## 2022-01-18 HISTORY — PX: COLONOSCOPY WITH PROPOFOL: SHX5780

## 2022-01-18 SURGERY — COLONOSCOPY WITH PROPOFOL
Anesthesia: Monitor Anesthesia Care

## 2022-01-18 MED ORDER — PROPOFOL 500 MG/50ML IV EMUL
INTRAVENOUS | Status: DC | PRN
Start: 2022-01-18 — End: 2022-01-18
  Administered 2022-01-18: 80 ug/kg/min via INTRAVENOUS

## 2022-01-18 MED ORDER — SODIUM CHLORIDE 0.9 % IV SOLN: INTRAVENOUS | Status: DC

## 2022-01-18 MED ORDER — LIDOCAINE HCL (CARDIAC) PF 100 MG/5ML IV SOSY
PREFILLED_SYRINGE | INTRAVENOUS | Status: DC | PRN
Start: 2022-01-18 — End: 2022-01-18
  Administered 2022-01-18: 100 mg via INTRAVENOUS

## 2022-01-18 MED ORDER — LACTATED RINGERS IV SOLN
INTRAVENOUS | Status: DC
Start: 2022-01-18 — End: 2022-01-18

## 2022-01-18 MED ORDER — PROPOFOL 10 MG/ML IV BOLUS
INTRAVENOUS | Status: DC | PRN
  Administered 2022-01-18: 40 mg via INTRAVENOUS
  Administered 2022-01-18: 25 mg via INTRAVENOUS
  Administered 2022-01-18: 40 mg via INTRAVENOUS
  Administered 2022-01-18: 20 mg via INTRAVENOUS

## 2022-01-18 SURGICAL SUPPLY — 22 items

## 2022-01-18 NOTE — Op Note (Signed)
[facility] ?Patient Name: [name] ?Procedure Date: 01/18/2022 ?MRN: [id number] ?Attending MD: Carol Ada , MD ?Date of Birth: June 06, 1966 ?CSN: [id number] ?Age: 56 ?Admit Type: Outpatient ?Procedure:                Colonoscopy ?Indications:              Screening for colorectal malignant neoplasm ?Providers:                Carol Ada, MD, Mikey College, RN, Elberta Fortis  ?                          Johnella Moloney, Technician ?Referring MD:              ?Medicines:                Propofol per Anesthesia ?Complications:            No immediate complications. ?Estimated Blood Loss:     Estimated blood loss: none. ?Procedure:                Pre-Anesthesia Assessment: ?                          - Prior to the procedure, a History and Physical  ?                          was performed, and patient medications and  ?                          allergies were reviewed. The patient's tolerance of  ?                          previous anesthesia was also reviewed. The risks  ?                          and benefits of the procedure and the sedation  ?                          options and risks were discussed with the patient.  ?                          All questions were answered, and informed consent  ?                          was obtained. Prior Anticoagulants: The patient has  ?                          taken no previous anticoagulant or antiplatelet  ?                          agents. ASA Grade Assessment: III - A patient with  ?                          severe systemic disease. After reviewing the risks  ?  and benefits, the patient was deemed in  ?                          satisfactory condition to undergo the procedure. ?                          - Sedation was administered by an anesthesia  ?                          professional. Deep sedation was attained. ?                          After obtaining informed consent, the colonoscope  ?                          was passed under  direct vision. Throughout the  ?                          procedure, the patient's blood pressure, pulse, and  ?                          oxygen saturations were monitored continuously. The  ?                          CF-HQ190L ([id number]) Olympus colonoscope was  ?                          introduced through the anus and advanced to the the  ?                          cecum, identified by appendiceal orifice and  ?                          ileocecal valve. The colonoscopy was performed  ?                          without difficulty. The patient tolerated the  ?                          procedure well. The quality of the bowel  ?                          preparation was evaluated using the BBPS [facility]  ?                          Bowel Preparation Scale) with scores of: Right  ?                          Colon = 3 (entire mucosa seen well with no residual  ?                          staining, small fragments of stool or opaque  ?  liquid), Transverse Colon = 3 (entire mucosa seen  ?                          well with no residual staining, small fragments of  ?                          stool or opaque liquid) and Left Colon = 3 (entire  ?                          mucosa seen well with no residual staining, small  ?                          fragments of stool or opaque liquid). The total  ?                          BBPS score equals 9. The quality of the bowel  ?                          preparation was good. The ileocecal valve,  ?                          appendiceal orifice, and rectum were photographed. ?Scope In: 8:12:41 AM ?Scope Out: 8:30:58 AM ?Scope Withdrawal Time: 0 hours 14 minutes 15 seconds  ?Total Procedure Duration: 0 hours 18 minutes 17 seconds  ?Findings: ?     Three sessile polyps were found in the ascending colon and cecum. The  ?     polyps were 2 to 5 mm in size. These polyps were removed with a cold  ?     snare. Resection and retrieval were complete. ?Impression:                - Three 2 to 5 mm polyps in the ascending colon and  ?                          in the cecum, removed with a cold snare. Resected  ?                          and retrieved. ?Moderate Sedation: ?     Not Applicable - Patient had care per Anesthesia. ?Recommendation:           - Patient has a contact number available for  ?                          emergencies. The signs and symptoms of potential  ?                          delayed complications were discussed with the  ?                          patient. Return to normal activities tomorrow.  ?                          Written discharge instructions were provided to the  ?  patient. ?                          - Resume previous diet. ?                          - Continue present medications. ?                          - Await pathology results. ?                          - Repeat colonoscopy in 5 years for surveillance. ?Procedure Code(s):        --- Professional --- ?                          [phone], Colonoscopy, flexible; with removal of  ?                          tumor(s), polyp(s), or other lesion(s) by snare  ?                          technique ?Diagnosis Code(s):        --- Professional --- ?                          Z12.11, Encounter for screening for malignant  ?                          neoplasm of colon ?                          K63.5, Polyp of colon ?CPT copyright 2019 American Medical Association. All rights reserved. ?The codes documented in this report are preliminary and upon coder review may  ?be revised to meet current compliance requirements. ?Carol Ada, MD ?Carol Ada, MD ?01/18/2022 8:38:57 AM ?This report has been signed electronically. ?Number of Addenda: 0 ?

## 2022-01-18 NOTE — H&P (Signed)
[name] ?HPI: This 56 year old black female presents to the office for colorectal cancer screening. She has 2 BM's per day. She has had 2 episodes of bright red blood in the stool within the last year. She has a good appetite and her weight has been stable. She denies having any complaints of abdominal pain, nausea, vomiting, acid reflux, dysphagia or odynophagia. She denies having a family history of colon cancer, celiac sprue or IBD. She reportedly had a negative stool Cologuard DNA test done ?2019-2020 ordered by Dr. Garwin Liu.   ? ?Past Medical History:  ?Diagnosis Date  ? Hypertension   ? ? ?Past Surgical History:  ?Procedure Laterality Date  ? ABLATION    ? BREAST SURGERY    ? lump removed  ? EYE SURGERY    ? ? ?History reviewed. No pertinent family history. ? ?Social History:  reports that she has never smoked. She does not have any smokeless tobacco history on file. She reports that she does not drink alcohol and does not use drugs. ? ?Allergies:  ?Allergies  ?Allergen Reactions  ? Tetracyclines & Related   ?  Reaction: uNKNOWN  ? Zithromax [Azithromycin] Swelling and Other (See Comments)  ?  Reaction: SWELLING IN LIPS  ? ? ?Medications: Scheduled: ?Continuous: ? sodium chloride    ? lactated ringers    ? ? ?No results found for this or any previous visit (from the past 24 hour(s)).  ? ?No results found. ? ?ROS:  As stated above in the HPI otherwise negative. ? ?There were no vitals taken for this visit.   ? ?PE: ?Gen: NAD, Alert and Oriented ?HEENT:  [place]/AT, EOMI ?Neck: Supple, no LAD ?Lungs: CTA Bilaterally ?CV: RRR without M/G/R ?ABD: Soft, NTND, +BS ?Ext: No C/C/E ? ?Assessment/Plan: ?1) Screening colonoscopy ? ?[name] ?01/18/2022, 7:15 AM  ? ? ?  ? ?

## 2022-01-18 NOTE — Anesthesia Postprocedure Evaluation (Signed)
Anesthesia Post Note ? ?Patient: [name] ? ?Procedure(s) Performed: COLONOSCOPY WITH PROPOFOL ?POLYPECTOMY ? ?  ? ?Patient location during evaluation: Endoscopy ?Anesthesia Type: MAC ?Level of consciousness: awake and alert ?Pain management: pain level controlled ?Vital Signs Assessment: post-procedure vital signs reviewed and stable ?Respiratory status: spontaneous breathing, nonlabored ventilation, respiratory function stable and patient connected to nasal cannula oxygen ?Cardiovascular status: blood pressure returned to baseline and stable ?Postop Assessment: no apparent nausea or vomiting ?Anesthetic complications: no ? ? ?No notable events documented. ? ?Last Vitals:  ?Vitals:  ? 01/18/22 0736 01/18/22 0839  ?BP: (!) 158/67 125/61  ?Pulse: 83 82  ?Resp: (!) 21 (!) 34  ?Temp: 36.9 ?C 36.5 ?C  ?SpO2: 98% 100%  ?  ?Last Pain:  ?Vitals:  ? 01/18/22 0839  ?TempSrc: Oral  ?PainSc: 0-No pain  ? ? ?  ?  ?  ?  ?  ?  ? ?Barnet Glasgow ? ? ? ? ?

## 2022-01-18 NOTE — Anesthesia Procedure Notes (Signed)
Procedure Name: [place] ?Date/Time: 01/18/2022 8:06 AM ?Performed by: Raenette Rover, CRNA ?Pre-anesthesia Checklist: Patient identified, Emergency Drugs available, Suction available and Patient being monitored ?Patient Re-evaluated:Patient Re-evaluated prior to induction ?Preoxygenation: Pre-oxygenation with 100% oxygen ?Induction Type: IV induction ?Placement Confirmation: positive ETCO2 and breath sounds checked- equal and bilateral ?Dental Injury: Teeth and Oropharynx as per pre-operative assessment  ? ? ? ? ?

## 2022-01-18 NOTE — Transfer of Care (Signed)
Immediate Anesthesia Transfer of Care Note ? ?Patient: [name] ? ?Procedure(s) Performed: COLONOSCOPY WITH PROPOFOL ?POLYPECTOMY ? ?Patient Location: Endoscopy Unit ? ?Anesthesia Type:MAC ? ?Level of Consciousness: awake, alert , oriented, drowsy and patient cooperative ? ?Airway & Oxygen Therapy: Patient Spontanous Breathing and Patient connected to face mask oxygen ? ?Post-op Assessment: Report given to RN and Post -op Vital signs reviewed and stable ? ?Post vital signs: Reviewed and stable ? ?Last Vitals:  ?Vitals Value Taken Time  ?BP 125/61 01/18/22 0836  ?Temp    ?Pulse 81 01/18/22 0837  ?Resp 18 01/18/22 0837  ?SpO2 100 % 01/18/22 0837  ?Vitals shown include unvalidated device data. ? ?Last Pain:  ?Vitals:  ? 01/18/22 0736  ?TempSrc: Oral  ?PainSc: 0-No pain  ?   ? ?  ? ?Complications: No notable events documented. ?

## 2022-01-21 ENCOUNTER — Encounter (HOSPITAL_COMMUNITY): Payer: Self-pay | Admitting: Gastroenterology

## 2022-01-21 LAB — SURGICAL PATHOLOGY

## 2022-05-17 ENCOUNTER — Ambulatory Visit: Payer: 59 | Admitting: Podiatry

## 2022-05-17 ENCOUNTER — Ambulatory Visit (INDEPENDENT_AMBULATORY_CARE_PROVIDER_SITE_OTHER): Payer: 59

## 2022-05-17 DIAGNOSIS — M7662 Achilles tendinitis, left leg: Secondary | ICD-10-CM | POA: Diagnosis not present

## 2022-05-17 DIAGNOSIS — M722 Plantar fascial fibromatosis: Secondary | ICD-10-CM

## 2022-05-17 DIAGNOSIS — M7661 Achilles tendinitis, right leg: Secondary | ICD-10-CM

## 2022-05-17 MED ORDER — MELOXICAM 15 MG PO TABS: 15.0000 mg | ORAL_TABLET | Freq: Every day | ORAL | 1 refills | Status: DC

## 2022-05-17 MED ORDER — METHYLPREDNISOLONE 4 MG PO TBPK: ORAL_TABLET | ORAL | 0 refills | Status: DC

## 2022-05-30 DIAGNOSIS — M7661 Achilles tendinitis, right leg: Secondary | ICD-10-CM | POA: Diagnosis not present

## 2022-05-30 DIAGNOSIS — M7662 Achilles tendinitis, left leg: Secondary | ICD-10-CM | POA: Diagnosis not present

## 2022-05-30 MED ORDER — BETAMETHASONE SOD PHOS & ACET 6 (3-3) MG/ML IJ SUSP
3.0000 mg | Freq: Once | INTRAMUSCULAR | Status: AC
  Administered 2022-05-30: 3 mg via INTRA_ARTICULAR

## 2022-05-30 NOTE — Progress Notes (Signed)
   Chief Complaint  Patient presents with   Foot Pain    Bilateral heel pain    HPI: 56 y.o. female presenting today as a new patient for evaluation of bilateral heel pain.  This has been ongoing for about 2-3 months now.  Patient states that she has consistent dull pain to the posterior heels.  She denies a history of injury.  She has not done anything currently for treatment.  Past Medical History:  Diagnosis Date   Hypertension     Past Surgical History:  Procedure Laterality Date   ABLATION     BREAST SURGERY     lump removed   COLONOSCOPY WITH PROPOFOL N/A 01/18/2022   Procedure: COLONOSCOPY WITH PROPOFOL;  Surgeon: Carol Ada, MD;  Location: [facility];  Service: Endoscopy;  Laterality: N/A;   EYE SURGERY     POLYPECTOMY  01/18/2022   Procedure: POLYPECTOMY;  Surgeon: Carol Ada, MD;  Location: [facility];  Service: Endoscopy;;    Allergies  Allergen Reactions   Tetracyclines & Related     Reaction: uNKNOWN   Zithromax [Azithromycin] Swelling and Other (See Comments)    Reaction: SWELLING IN LIPS     Physical Exam: General: The patient is alert and oriented x3 in no acute distress.  Dermatology: Skin is warm, dry and supple bilateral lower extremities. Negative for open lesions or macerations.  Vascular: Palpable pedal pulses bilaterally. No edema or erythema noted. Capillary refill within normal limits.  Neurological: Epicritic and protective threshold grossly intact bilaterally.   Musculoskeletal Exam: Pain on palpation noted to the posterior tubercle of the bilateral calcaneus at the insertion of the Achilles tendon consistent with retrocalcaneal bursitis. Range of motion within normal limits. Muscle strength 5/5 in all muscle groups bilateral lower extremities.  Radiographic Exam B/L feet 05/17/2022:  Posterior and inferior calcaneal spurs noted to the respective calcaneus on lateral view. No fracture or dislocation noted. Normal osseous mineralization  noted.     Assessment: 1. Insertional Achilles tendinitis bilateral 2.  Posterior and plantar heel spurs bilateral  Plan of Care:  1. Patient was evaluated. Radiographs were reviewed today. 2. Injection of 0.5 mL Celestone Soluspan injected into the retrocalcaneal bursa. Care was taken to avoid direct injection into the Achilles tendon. 3.  Prescription for Medrol Dosepak 4.  Prescription for meloxicam 15 mg daily 5.  Recommend good supportive shoes.  Patient inquired about different shoe brands to wear and recommended [employer]oka's 6.  Return to clinic in 4 weeks  *Leaving for an 8-day cruise next week   Edrick Kins, DPM [facility]  Dr. Edrick Kins, DPM    [address], [place] [address]                Office [phone]  Fax [phone]

## 2022-06-21 ENCOUNTER — Ambulatory Visit: Payer: 59 | Admitting: Podiatry

## 2022-06-21 DIAGNOSIS — M7662 Achilles tendinitis, left leg: Secondary | ICD-10-CM

## 2022-06-21 DIAGNOSIS — M7661 Achilles tendinitis, right leg: Secondary | ICD-10-CM | POA: Diagnosis not present

## 2022-06-21 NOTE — Progress Notes (Signed)
   Chief Complaint  Patient presents with   Foot Pain    BIL heel pain 1 mth f/u    HPI: 56 y.o. female presenting today for follow-up evaluation of bilateral heel pain.  This has been ongoing for about 3 months now.  Patient states that she has consistent dull pain to the posterior heels.   Patient states that the injections and Medrol Dosepak did help temporarily.  Unfortunately the pain has slowly returned.  She continues to take the meloxicam daily.  Presenting for follow-up treatment and evaluation  Past Medical History:  Diagnosis Date   Hypertension     Past Surgical History:  Procedure Laterality Date   ABLATION     BREAST SURGERY     lump removed   COLONOSCOPY WITH PROPOFOL N/A 01/18/2022   Procedure: COLONOSCOPY WITH PROPOFOL;  Surgeon: Carol Ada, MD;  Location: [facility];  Service: Endoscopy;  Laterality: N/A;   EYE SURGERY     POLYPECTOMY  01/18/2022   Procedure: POLYPECTOMY;  Surgeon: Carol Ada, MD;  Location: [facility];  Service: Endoscopy;;    Allergies  Allergen Reactions   Tetracyclines & Related     Reaction: uNKNOWN   Zithromax [Azithromycin] Swelling and Other (See Comments)    Reaction: SWELLING IN LIPS     Physical Exam: General: The patient is alert and oriented x3 in no acute distress.  Dermatology: Skin is warm, dry and supple bilateral lower extremities. Negative for open lesions or macerations.  Vascular: Palpable pedal pulses bilaterally. No edema or erythema noted. Capillary refill within normal limits.  Neurological: Epicritic and protective threshold grossly intact bilaterally.   Musculoskeletal Exam: There continues to be some tenderness on palpation noted to the posterior tubercle of the bilateral calcaneus at the insertion of the Achilles tendon consistent with retrocalcaneal bursitis. Range of motion within normal limits. Muscle strength 5/5 in all muscle groups bilateral lower extremities.  Radiographic Exam B/L feet  05/17/2022:  Posterior and inferior calcaneal spurs noted to the respective calcaneus on lateral view. No fracture or dislocation noted. Normal osseous mineralization noted.     Assessment: 1. Insertional Achilles tendinitis bilateral 2.  Posterior and plantar heel spurs bilateral  Plan of Care:  1. Patient was evaluated. Radiographs were reviewed today. 2.  Prescription provided for the patient to take to [facility] physical therapy for PT 3.  Continue meloxicam 15 mg daily as needed 4.  Wearing good supportive shoes and sneakers.  Patient currently wears Hoka's 5.  Return to clinic 8 weeks  *Works at a sitting desk job  Edrick Kins, DPM [facility]  Dr. Edrick Kins, DPM    [address], [place] [address]                Office [phone]  Fax [phone]

## 2023-03-21 LAB — VITAMIN D 25 HYDROXY (VIT D DEFICIENCY, FRACTURES): Vit D, 25-Hydroxy: 25.7

## 2023-03-21 LAB — LIPID PANEL
LDL Cholesterol: 135
Triglycerides: 94 (ref 40–160)

## 2023-03-21 LAB — COMPREHENSIVE METABOLIC PANEL
Calcium: 10.9 — AB (ref 8.7–10.7)
eGFR: 86

## 2023-05-13 LAB — COMPREHENSIVE METABOLIC PANEL
Calcium: 10.4 (ref 8.7–10.7)
eGFR: 86

## 2023-05-13 LAB — BASIC METABOLIC PANEL
BUN: 11 (ref 4–21)
Creatinine: 0.8 (ref 0.5–1.1)

## 2023-05-13 LAB — TSH: TSH: 1.51 (ref 0.41–5.90)

## 2023-07-08 ENCOUNTER — Ambulatory Visit: Payer: 59 | Admitting: Nurse Practitioner

## 2023-08-26 NOTE — Patient Instructions (Signed)
Hypercalcemia Hypercalcemia is when the level of calcium in a person's blood is above normal. The body needs calcium to make bones and keep them strong. Calcium also helps the muscles, nerves, brain, and heart work the way they should. Most of the calcium in the body is stored in the bones. There is also calcium in the blood. Hypercalcemia occurs when there is too much calcium in your blood. Calcium levels in the blood are regulated by hormones, kidneys, and the gastrointestinal tract.  Hypercalcemia can happen when calcium comes out of the bones, or when the kidneys are not able to remove calcium from the blood. Hypercalcemia can be mild or severe. What are the causes? There are many possible causes of hypercalcemia. Common causes of this condition include: Hyperparathyroidism. This is a condition in which the body produces too much parathyroid hormone. There are four parathyroid glands in your neck. These glands produce a chemical messenger (hormone) that helps the body absorb calcium from foods and helps your bones release calcium. Certain kinds of cancer. Less common causes of hypercalcemia include: Calcium and vitamin D dietary supplements. Chronic kidney disease. Hyperthyroidism. Severe dehydration. Being on bed rest or being inactive for a long time. Certain medicines. Infections. What increases the risk? You are more likely to develop this condition if: You are female. You are 44 years of age or older. You have a family history of hypercalcemia. What are the signs or symptoms? Mild hypercalcemia that starts slowly may not cause symptoms. Severe, sudden hypercalcemia is more likely to cause symptoms, such as: Being more thirsty than usual. Needing to urinate more often than usual. Abdominal pain. Nausea and vomiting. Constipation. Muscle pain, twitching, or weakness. Feeling very tired. How is this diagnosed?  Hypercalcemia is usually diagnosed with a blood test. You may also  have tests to help check what is causing this condition. Tests include imaging tests and more blood tests. How is this treated? Treatment for hypercalcemia depends on the cause. Treatment may include: Receiving fluids through an IV. Medicines. These can be used to: Keep calcium levels steady after receiving fluids (loop diuretics). Keep calcium in your bones (bisphosphonates). Lower the calcium level in your blood. Surgery to remove overactive parathyroid glands. A procedure that filters your blood to correct calcium levels (hemodialysis). Follow these instructions at home:  Take over-the-counter and prescription medicines only as told by your health care provider. Follow instructions from your health care provider about eating or drinking restrictions. Drink enough fluid to keep your urine pale yellow. Stay active. Weight-bearing exercise helps to keep calcium in your bones. Follow instructions from your health care provider about what type and level of exercise is safe for you. Keep all follow-up visits. This is important. Contact a health care provider if: You have a fever. Your heartbeat is irregular or very fast. You have changes in mood, memory, or personality. Get help right away if: You have severe abdominal pain. You have chest pain. You have trouble breathing. You become very confused and sleepy. You lose consciousness. These symptoms may represent a serious problem that is an emergency. Do not wait to see if the symptoms will go away. Get medical help right away. Call your local emergency services (911 in the [place] Do not drive yourself to the hospital. Summary Hypercalcemia is when the level of calcium in a person's blood is above normal. The body needs calcium to make bones and keep them strong. There are many possible causes of hypercalcemia, and treatment depends on  the cause. Take over-the-counter and prescription medicines only as told by your health care  provider. This information is not intended to replace advice given to you by your health care provider. Make sure you discuss any questions you have with your health care provider. Document Revised: 03/28/2021 Document Reviewed: 03/28/2021 Elsevier Patient Education  2024 [employer].

## 2023-08-28 ENCOUNTER — Encounter: Payer: Self-pay | Admitting: Nurse Practitioner

## 2023-08-28 ENCOUNTER — Ambulatory Visit (INDEPENDENT_AMBULATORY_CARE_PROVIDER_SITE_OTHER): Payer: 59 | Admitting: Nurse Practitioner

## 2023-08-28 MED ORDER — OLMESARTAN MEDOXOMIL 20 MG PO TABS: 20.0000 mg | ORAL_TABLET | Freq: Every day | ORAL | 1 refills | Status: DC

## 2023-08-28 NOTE — Progress Notes (Signed)
Endocrinology Consult Note       08/28/2023, 4:32 PM  SUBJECTIVE: [name] is a 57 y.o.-year-old female, referred by her  Meagan Found, MD, for evaluation for hypercalcemia/hyperparathyroidism.   Past Medical History:  Diagnosis Date   Hypertension     Past Surgical History:  Procedure Laterality Date   ABLATION     BREAST SURGERY     lump removed   COLONOSCOPY WITH PROPOFOL N/A 01/18/2022   Procedure: COLONOSCOPY WITH PROPOFOL;  Surgeon: Meagan Hawking, MD;  Location: [facility];  Service: Endoscopy;  Laterality: N/A;   EYE SURGERY     POLYPECTOMY  01/18/2022   Procedure: POLYPECTOMY;  Surgeon: Meagan Hawking, MD;  Location: [facility];  Service: Endoscopy;;    Social History   Tobacco Use   Smoking status: Never  Substance Use Topics   Alcohol use: No   Drug use: No    History reviewed. No pertinent family history.  Outpatient Encounter Medications as of 08/28/2023  Medication Sig   cholecalciferol (VITAMIN D3) 25 MCG (1000 UNIT) tablet Take 1,000 Units by mouth daily. Gummy   meloxicam (MOBIC) 15 MG tablet Take 1 tablet (15 mg total) by mouth daily.   Multiple Vitamin (MULTIVITAMIN WITH MINERALS) TABS Take 1 tablet by mouth daily.   olmesartan (BENICAR) 20 MG tablet Take 1 tablet (20 mg total) by mouth daily.   [DISCONTINUED] olmesartan-hydrochlorothiazide (BENICAR HCT) 40-25 MG tablet Take 0.5 tablets by mouth daily.   methylPREDNISolone (MEDROL DOSEPAK) 4 MG TBPK tablet 6 day dose pack - take as directed (Patient not taking: Reported on 08/28/2023)   No facility-administered encounter medications on file as of 08/28/2023.    Allergies  Allergen Reactions   Tetracyclines & Related     Reaction: uNKNOWN   Zithromax [Azithromycin] Swelling and Other (See Comments)    Reaction: SWELLING IN LIPS     HPI  [name] was diagnosed with hypercalcemia in 2022.  Patient has no previously  known history of parathyroid, pituitary, adrenal dysfunctions; no family history of such dysfunctions. -Review of herreferral package of most recent labs reveals calcium of 10.4the corresponding PTH of 58 on 05/13/23.  She has never had a Dexa scan in the past.  Will consider adding one at next visit.  No prior history of fragility fractures or falls. No history of kidney stones.  No history of CKD. Last BUN/Cr: 11/0.81  she IS ON HCTZ or other thiazide therapy which can contribute to high calcium levels in the blood.  She also has a history of vitamin D deficiency. Last vitamin D level was 25.7 on 03/21/23- currently on Ergocalciferol.  she is not on calcium supplements,  she eats dairy and green, leafy, vegetables on average amounts.  she does not have a family history of hypercalcemia, pituitary tumors, thyroid cancer, or osteoporosis.   I reviewed her chart and she also has a history of HLD, HTN, vitamin d deficiency.    ROS:  Constitutional: + difficulty losing weight, no fatigue, no subjective hyperthermia, no subjective hypothermia Eyes: no blurry Meagan, no xerophthalmia ENT: no sore throat, no nodules palpated in throat, no dysphagia/odynophagia, no hoarseness Cardiovascular: no  Chest Pain, no Shortness of Breath, no palpitations, no leg swelling Respiratory: no cough, no shortness of breath  Gastrointestinal: no Nausea/Vomiting/Diarrhea Musculoskeletal: no muscle/joint aches Skin: no rashes Neurological: no tremors, no numbness, no tingling, no dizziness Psychiatric: no depression, no anxiety    OBJECTIVE:  BP 136/76 (BP Location: Left Arm, Patient Position: Sitting, Cuff Size: Large)   Pulse 86   Ht 5\' 7"  (1.702 m)   Wt (!) 347 lb (157.4 kg)   BMI 54.35 kg/m , Body mass index is 54.35 kg/m. Wt Readings from Last 3 Encounters:  08/28/23 (!) 347 lb (157.4 kg)  01/18/22 (!) 335 lb (152 kg)  10/31/14 (!) 315 lb (142.9 kg)    BP Readings from Last 3 Encounters:   08/28/23 136/76  01/18/22 (!) 111/58  10/31/14 121/71     Physical Exam- Limited  Constitutional:  Body mass index is 54.35 kg/m. , not in acute distress, normal state of mind Eyes:  EOMI, no exophthalmos Neck: Supple Cardiovascular: RRR, no murmurs, rubs, or gallops, no edema Respiratory: Adequate breathing efforts, no crackles, rales, rhonchi, or wheezing Musculoskeletal: no gross deformities, strength intact in all four extremities, no gross restriction of joint movements Skin:  no rashes, no hyperemia Neurological: no tremor with outstretched hands   CMP ( most recent) CMP     Component Value Date/Time   NA 138 04/11/2010 1829   K 3.3 (L) 04/11/2010 1829   CL 101 04/11/2010 1829   CO2 29 04/11/2010 1829   GLUCOSE 93 04/11/2010 1829   BUN 11 05/13/2023 0000   CREATININE 0.8 05/13/2023 0000   CREATININE 0.77 04/11/2010 1829   CALCIUM 10.4 05/13/2023 0000   PROT 6.2 07/25/2007 1020   ALBUMIN 3.0 (L) 07/25/2007 1020   AST 14 07/25/2007 1020   ALT 11 07/25/2007 1020   ALKPHOS 52 07/25/2007 1020   BILITOT 0.4 07/25/2007 1020   EGFR 86 05/13/2023 0000   GFRNONAA >60 04/11/2010 1829     Diabetic Labs (most recent): No results Liu for: "HGBA1C", "MICROALBUR"   Lipid Panel ( most recent) Lipid Panel     Component Value Date/Time   TRIG 94 03/21/2023 0000   LDLCALC 135 03/21/2023 0000      Lab Results  Component Value Date   TSH 1.51 05/13/2023      Assessment/Plan: 1. Hypercalcemia / Hyperparathyroidism  Patient has had several instances of elevated calcium, with the highest level being at 10.9 mg/dL. A corresponding intact PTH level was also marginally high, at 66.  - Patient also has vitamin D deficiency,  with the last level being 25.7- currently on Ergocalciferol.  - No apparent complications from hypercalcemia/hyperparathyroidism: no history of nephrolithiasis,  osteoporosis,fragility fractures. No abdominal pain, no major mood disorders, no bone  pain.  - I discussed with the patient about the physiology of calcium and parathyroid hormone, and possible  effects of  increased PTH/ Calcium , including kidney stones, cardiac dysrhythmias, osteoporosis, abdominal pain, etc.   - The work up so far is not sufficient to reach a conclusion for definitive therapy.  she  needs more studies to confirm and classify the parathyroid dysfunction she may have. I will proceed to obtain  repeat intact PTH/calcium, serum magnesium, serum phosphorus.  It is also essential to obtain 24-hour urine calcium/creatinine to rule out the rare but important cause of mild elevation in calcium and PTH- FHH ( Familial Hypocalciuric Hypercalcemia), which may not require any active intervention.  - I will request for her next  DEXA scan to include the distal  33% of  radius for evaluation of cortical bone, which is predominantly affected by hyperparathyroidism.   -She is on BP medication that has HCT in it, which may be causing this calcium elevation.  She will be taken off of this temporarily to test this theory as well.  I sent in for Benicar 20 mg po daily for now.  2. Weight management Once her parathyroid problem is fully assessed and under control, she would like to discuss weight management options. We did briefly go over diet today.  She notes her trouble is eating on a pattern.  Sometimes she forgets to eat all day.  The following Lifestyle Medicine recommendations according to American College of Lifestyle Medicine [facility]) were discussed and offered to patient and she agrees to start the journey:  A. Whole Foods, Plant-based plate comprising of fruits and vegetables, plant-based proteins, whole-grain carbohydrates was discussed in detail with the patient.   A list for source of those nutrients were also provided to the patient.  Patient will use only water or unsweetened tea for hydration. B.  The need to stay away from risky substances including alcohol, smoking;  obtaining 7 to 9 hours of restorative sleep, at least 150 minutes of moderate intensity exercise weekly, the importance of healthy social connections,  and stress reduction techniques were discussed. C.  A full color page of  Calorie density of various food groups per pound showing examples of each food groups was provided to the patient.    - Time spent with the patient: 60 minutes, of which >50% was spent in obtaining information about her symptoms, reviewing her previous labs, evaluations, and treatments, counseling her about her  hypercalcemia, and developing a plan to confirm the diagnosis and long term treatment as necessary.  Please refer to " Patient Self Inventory" in the Media  tab for reviewed elements of pertinent patient history.  Virgel Gess participated in the discussions, expressed understanding, and voiced agreement with the above plans.  All questions were answered to her satisfaction. she is encouraged to contact clinic should she have any questions or concerns prior to her return visit.   FOLLOW UP PLAN: - Return after labs and urine results are in.  Ronny Bacon, [facility] [address] [place], [place] [address] Phone: [phone] Fax: [phone]  08/28/2023, 4:32 PM

## 2023-09-12 LAB — VITAMIN D 25 HYDROXY (VIT D DEFICIENCY, FRACTURES): Vit D, 25-Hydroxy: 31.5 ng/mL (ref 30.0–100.0)

## 2023-09-12 LAB — COMPREHENSIVE METABOLIC PANEL
ALT: 13 [IU]/L (ref 0–32)
AST: 15 [IU]/L (ref 0–40)
Albumin: 4.1 g/dL (ref 3.8–4.9)
Alkaline Phosphatase: 83 [IU]/L (ref 44–121)
BUN/Creatinine Ratio: 10 (ref 9–23)
BUN: 9 mg/dL (ref 6–24)
Bilirubin Total: 0.3 mg/dL (ref 0.0–1.2)
CO2: 24 mmol/L (ref 20–29)
Calcium: 10.5 mg/dL — ABNORMAL HIGH (ref 8.7–10.2)
Chloride: 106 mmol/L (ref 96–106)
Creatinine, Ser: 0.86 mg/dL (ref 0.57–1.00)
Globulin, Total: 2 g/dL (ref 1.5–4.5)
Glucose: 110 mg/dL — ABNORMAL HIGH (ref 70–99)
Potassium: 4.5 mmol/L (ref 3.5–5.2)
Sodium: 142 mmol/L (ref 134–144)
Total Protein: 6.1 g/dL (ref 6.0–8.5)
eGFR: 80 mL/min/{1.73_m2} (ref >59)

## 2023-09-12 LAB — MAGNESIUM: Magnesium: 2 mg/dL (ref 1.6–2.3)

## 2023-09-12 LAB — PHOSPHORUS: Phosphorus: 2.7 mg/dL — ABNORMAL LOW (ref 3.0–4.3)

## 2023-09-12 LAB — PTH, INTACT AND CALCIUM: PTH: 77 pg/mL — ABNORMAL HIGH (ref 15–65)

## 2024-02-23 ENCOUNTER — Other Ambulatory Visit: Payer: Self-pay | Admitting: Nurse Practitioner

## 2024-03-23 ENCOUNTER — Other Ambulatory Visit: Payer: Self-pay | Admitting: Nurse Practitioner

## 2024-04-20 ENCOUNTER — Other Ambulatory Visit: Payer: Self-pay | Admitting: Nurse Practitioner

## 2024-04-21 ENCOUNTER — Other Ambulatory Visit: Payer: Self-pay | Admitting: Nurse Practitioner

## 2024-09-16 ENCOUNTER — Encounter (INDEPENDENT_AMBULATORY_CARE_PROVIDER_SITE_OTHER): Payer: Self-pay | Admitting: Primary Care

## 2024-09-16 ENCOUNTER — Ambulatory Visit (INDEPENDENT_AMBULATORY_CARE_PROVIDER_SITE_OTHER): Payer: Self-pay | Admitting: Primary Care

## 2024-09-16 VITALS — BP 122/70 | HR 82 | Resp 16 | Ht 66.0 in | Wt 352.4 lb

## 2024-09-16 DIAGNOSIS — Z760 Encounter for issue of repeat prescription: Secondary | ICD-10-CM

## 2024-09-16 DIAGNOSIS — Z7689 Persons encountering health services in other specified circumstances: Secondary | ICD-10-CM

## 2024-09-16 DIAGNOSIS — I10 Essential (primary) hypertension: Secondary | ICD-10-CM

## 2024-09-16 DIAGNOSIS — Z23 Encounter for immunization: Secondary | ICD-10-CM

## 2024-09-16 DIAGNOSIS — M9260 Juvenile osteochondrosis of tarsus, unspecified ankle: Secondary | ICD-10-CM | POA: Diagnosis not present

## 2024-09-16 MED ORDER — AMLODIPINE BESYLATE 5 MG PO TABS: 5.0000 mg | ORAL_TABLET | Freq: Every day | ORAL | 1 refills | Status: DC

## 2024-09-16 MED ORDER — OLMESARTAN MEDOXOMIL-HCTZ 20-12.5 MG PO TABS: 1.0000 | ORAL_TABLET | Freq: Every day | ORAL | 1 refills | Status: DC

## 2024-09-16 MED ORDER — CELECOXIB 100 MG PO CAPS: 100.0000 mg | ORAL_CAPSULE | Freq: Two times a day (BID) | ORAL | 0 refills | Status: DC

## 2024-09-16 NOTE — Progress Notes (Signed)
 New Patient Office Visit  Subjective    Patient ID: [name] female  DOB: 1966-03-08  Age: 58 y.o. MRN: [id number]   CC:  Establish care HPI     New Patient (Initial Visit)    Additional comments: Establish care  Left shoulder pain  Weight management       Last edited by Casimir Juvenal SAUNDERS, RMA on 09/16/2024  3:34 PM.      HPI  [name] is a 85 severe morbid obese female referred by GYN Dr. Rutherford.  She voices her main concerns she is in to establish care, left shoulder pain, and weight management.  Explained to patient will not discuss weight management to the first of the year after the holidays.  Then she mentioned some the things that her and Dr. Rutherford discussed.  Discussed with Dr. Rutherford about the referral she stated needed to be made.  Discussed with recommended no referrals unless warranted.  Cardiology for weight loss no will do a EKG no signs and symptoms of cardiac problems.  Yes it is true if you do have pre-existing conditions expected actually cardiac weight loss may be approved.  Hypertension is well-controlled will refill medic patients.  Left shoulder pain unknown etiology denies any trauma or falls.  No labs will request labs from OB/GYN patient will sign for transfer/permission of records. Current Outpatient Medications on File Prior to Visit  Medication Sig Dispense Refill   Multiple Vitamin (MULTIVITAMIN WITH MINERALS) TABS Take 1 tablet by mouth daily.     No current facility-administered medications on file prior to visit.     Allergies  Allergen Reactions   Tetracyclines & Related     Reaction: uNKNOWN   Zithromax [Azithromycin] Swelling and Other (See Comments)    Reaction: SWELLING IN LIPS    Past Medical History:  Diagnosis Date   Hypertension      Past Surgical History:  Procedure Laterality Date   ABLATION     BREAST SURGERY     lump removed   COLONOSCOPY WITH PROPOFOL  N/A 01/18/2022   Procedure: COLONOSCOPY WITH  PROPOFOL ;  Surgeon: Rollin Dover, MD;  Location: [facility];  Service: Endoscopy;  Laterality: N/A;   EYE SURGERY     POLYPECTOMY  01/18/2022   Procedure: POLYPECTOMY;  Surgeon: Rollin Dover, MD;  Location: [facility];  Service: Endoscopy;;     History reviewed. No pertinent family history.  Social History   Socioeconomic History   Marital status: Legally Separated    Spouse name: Not on file   Number of children: Not on file   Years of education: Not on file   Highest education level: Bachelor's degree (e.g., BA, AB, BS)  Occupational History   Not on file  Tobacco Use   Smoking status: Never   Smokeless tobacco: Not on file  Substance and Sexual Activity   Alcohol use: No   Drug use: No   Sexual activity: Not on file  Other Topics Concern   Not on file  Social History Narrative   Not on file   Social Drivers of Health   Financial Resource Strain: Medium Risk (09/09/2024)   Overall Financial Resource Strain (CARDIA)    Difficulty of Paying Living Expenses: Somewhat hard  Food Insecurity: No Food Insecurity (09/09/2024)   Hunger Vital Sign    Worried About Running Out of Food in the Last Year: Never true    Ran Out of Food in the Last Year: Never true  Transportation Needs:  No Transportation Needs (09/09/2024)   PRAPARE - Administrator, Civil Service (Medical): No    Lack of Transportation (Non-Medical): No  Physical Activity: Insufficiently Active (09/09/2024)   Exercise Vital Sign    Days of Exercise per Week: 1 day    Minutes of Exercise per Session: 20 min  Stress: Stress Concern Present (09/09/2024)   [employer] of Occupational Health - Occupational Stress Questionnaire    Feeling of Stress: To some extent  Social Connections: Moderately Integrated (09/09/2024)   Social Connection and Isolation Panel    Frequency of Communication with Friends and Family: Three times a week    Frequency of Social Gatherings with Friends and Family: Three  times a week    Attends Religious Services: More than 4 times per year    Active Member of Clubs or Organizations: Yes    Attends Banker Meetings: More than 4 times per year    Marital Status: Widowed  Intimate Partner Violence: Not on file       Health Maintenance  Topic Date Due   HIV Screening  Never done   Hepatitis C Screening  Never done   DTaP/Tdap/Td vaccine (1 - Tdap) Never done   Hepatitis B Vaccine (1 of 3 - 19+ 3-dose series) Never done   Pap with HPV screening  Never done   Breast Cancer Screening  06/08/2016   Pneumococcal Vaccine for age over 38 (1 of 1 - PCV) Never done   Zoster (Shingles) Vaccine (1 of 2) Never done   Flu Shot  Never done   COVID-19 Vaccine (1 - 2025-26 season) Never done   Colon Cancer Screening  01/19/2032   HPV Vaccine  Aged Out   Meningitis B Vaccine  Aged Out    Objective    BP 122/70   Pulse 82   Resp 16   Ht 5' 6 (1.676 m)   Wt (!) 352 lb 6.4 oz (159.8 kg)   SpO2 98%   BMI 56.88 kg/m    Physical Exam Vitals reviewed.  Constitutional:      Appearance: Normal appearance. She is obese.     Comments: Severe morbid  HENT:     Head: Normocephalic.     Right Ear: Tympanic membrane, ear canal and external ear normal.     Left Ear: Tympanic membrane, ear canal and external ear normal.     Nose: Nose normal.     Mouth/Throat:     Mouth: Mucous membranes are moist.  Eyes:     Extraocular Movements: Extraocular movements intact.     Pupils: Pupils are equal, round, and reactive to light.  Cardiovascular:     Rate and Rhythm: Normal rate.  Pulmonary:     Effort: Pulmonary effort is normal.     Breath sounds: Normal breath sounds.  Abdominal:     General: Bowel sounds are normal.     Palpations: Abdomen is soft.  Musculoskeletal:        General: Tenderness present. Normal range of motion.     Cervical back: Normal range of motion.     Comments: Left shoulder  Skin:    General: Skin is warm and dry.   Neurological:     Mental Status: She is alert and oriented to person, place, and time.  Psychiatric:        Mood and Affect: Mood normal.        Behavior: Behavior normal.        Thought  Content: Thought content normal.     {Labs (optional):23779}   Assessment & Plan:  Yemaya was seen today for new patient (initial visit).  Diagnoses and all orders for this visit:  Encounter to establish care  Calcaneal apophysitis -     Ambulatory referral to Podiatry -     celecoxib (CELEBREX) 100 MG capsule; Take 1 capsule (100 mg total) by mouth 2 (two) times daily.  Need for influenza vaccination -     Flu vaccine trivalent PF, 6mos and older(Flulaval,Afluria,Fluarix,Fluzone)  Other ordersEssential hypertension 2/2 Medication refill  -     amLODipine (NORVASC) 5 MG tablet; Take 1 tablet (5 mg total) by mouth daily. -     olmesartan -hydrochlorothiazide (BENICAR  HCT) 20-12.5 MG tablet; Take 1 tablet by mouth daily.     Follow-up:  Return in about 4 weeks (around 10/14/2024) for re-check blood pressure.  The above assessment and management plan was discussed with the patient. The patient verbalized understanding of and has agreed to the management plan. Patient is aware to call the clinic if symptoms fail to improve or worsen. Patient is aware when to return to the clinic for a follow-up visit. Patient educated on when it is appropriate to go to the emergency department.   Rosaline Bohr, NP-C

## 2024-10-12 ENCOUNTER — Ambulatory Visit: Admitting: Podiatry

## 2024-10-21 ENCOUNTER — Ambulatory Visit (INDEPENDENT_AMBULATORY_CARE_PROVIDER_SITE_OTHER): Admitting: Primary Care

## 2024-10-26 ENCOUNTER — Ambulatory Visit: Admitting: Podiatry

## 2024-11-09 ENCOUNTER — Ambulatory Visit (INDEPENDENT_AMBULATORY_CARE_PROVIDER_SITE_OTHER): Admitting: Primary Care

## 2024-11-23 ENCOUNTER — Other Ambulatory Visit (INDEPENDENT_AMBULATORY_CARE_PROVIDER_SITE_OTHER): Payer: Self-pay | Admitting: Primary Care

## 2024-11-23 DIAGNOSIS — M9260 Juvenile osteochondrosis of tarsus, unspecified ankle: Secondary | ICD-10-CM

## 2024-11-23 NOTE — Telephone Encounter (Signed)
 Will forward to provider

## 2024-11-24 ENCOUNTER — Encounter: Payer: Self-pay | Admitting: Primary Care

## 2024-11-24 ENCOUNTER — Ambulatory Visit: Admitting: Primary Care

## 2024-11-24 VITALS — BP 129/88 | HR 81 | Temp 98.4°F | Resp 16 | Ht 66.0 in | Wt 356.6 lb

## 2024-11-24 DIAGNOSIS — I10 Essential (primary) hypertension: Secondary | ICD-10-CM

## 2024-11-24 MED ORDER — OLMESARTAN MEDOXOMIL 40 MG PO TABS: 40.0000 mg | ORAL_TABLET | Freq: Every day | ORAL | 1 refills | Status: AC

## 2024-11-24 MED ORDER — VITAMIN D3 50 MCG (2000 UT) PO CAPS: 2000.0000 [IU] | ORAL_CAPSULE | Freq: Every day | ORAL | 1 refills | Status: AC

## 2024-11-24 NOTE — Progress Notes (Signed)
 " [facility]  [name], is a 59 y.o. female  RDW:[id number]  FMW:[id number]  DOB - Aug 09, 1966  Chief Complaint  Patient presents with   Medication Management    Bp medicine possible change per endo        Subjective:   [name] is a 59 y.o. female here today for a follow up visit. Hypertension. Patient has No headache, No chest pain, No abdominal pain - No Nausea, No new weakness tingling or numbness, No Cough - shortness of breath. Seen by endo today recommended to stop hydrochlorothiazide secondary to hypercalcinemia. Norvasc  has been swelling ankles and causing feet soreness a side effect of medication. Patient understands this is a intolerance.     No problems updated.  Comprehensive ROS Pertinent positive and negative noted in HPI   Allergies[1]  Past Medical History:  Diagnosis Date   Hypertension     Medications Ordered Prior to Encounter[2] Health Maintenance  Topic Date Due   HIV Screening  Never done   Hepatitis C Screening  Never done   DTaP/Tdap/Td vaccine (1 - Tdap) Never done   Hepatitis B Vaccine (1 of 3 - 19+ 3-dose series) Never done   Pap with HPV screening  Never done   Breast Cancer Screening  06/08/2016   Pneumococcal Vaccine for age over 45 (1 of 1 - PCV) Never done   Zoster (Shingles) Vaccine (1 of 2) Never done   Flu Shot  Never done   COVID-19 Vaccine (1 - 2025-26 season) Never done   Colon Cancer Screening  01/19/2032   HPV Vaccine (No Doses Required) Completed   Meningitis B Vaccine  Aged Out    Objective:   Vitals:   11/24/24 1505  BP: 129/88  Pulse: 81  Resp: 16  Temp: 98.4 F (36.9 C)  SpO2: 96%  Weight: (!) 356 lb 9.6 oz (161.8 kg)  Height: 5' 6 (1.676 m)   BP Readings from Last 3 Encounters:  11/24/24 129/88  09/16/24 122/70  08/28/23 136/76      Physical Exam Vitals reviewed.  Constitutional:      Appearance: Normal appearance. She is obese.     Comments: Severe morbid obesity   HENT:      Head: Normocephalic.     Right Ear: Tympanic membrane, ear canal and external ear normal.     Left Ear: Tympanic membrane, ear canal and external ear normal.     Nose: Nose normal.     Mouth/Throat:     Mouth: Mucous membranes are moist.  Eyes:     Extraocular Movements: Extraocular movements intact.     Pupils: Pupils are equal, round, and reactive to light.  Cardiovascular:     Rate and Rhythm: Normal rate and regular rhythm.  Pulmonary:     Effort: Pulmonary effort is normal.     Breath sounds: Normal breath sounds.  Abdominal:     General: Bowel sounds are normal.     Palpations: Abdomen is soft.  Musculoskeletal:        General: Normal range of motion.     Cervical back: Normal range of motion.  Skin:    General: Skin is warm and dry.  Neurological:     Mental Status: She is alert and oriented to person, place, and time.  Psychiatric:        Mood and Affect: Mood normal.        Behavior: Behavior normal.        Thought Content: Thought content  normal.     Assessment & Plan  Kanita was seen today for medication management.  Diagnoses and all orders for this visit:  Essential hypertension Well controlled DIET: Limit salt intake, read nutrition labels to check salt content, limit fried and high fatty foods  Avoid using multisymptom OTC cold preparations that generally contain sudafed which can rise BP. Consult with pharmacist on best cold relief products to use for persons with HTN EXERCISE Discussed incorporating exercise such as walking - 30 minutes most days of the week and can do in 10 minute intervals    -     olmesartan  (BENICAR ) 40 MG tablet; Take 1 tablet (40 mg total) by mouth daily.  Morbid obesity (HCC) Discussed diet and exercise for person with BMI >25. Instructed: You must burn more calories than you eat. Losing 5 percent of your body weight should be considered a success. In the longer term, losing more than 15 percent of your body weight and staying  at this weight is an extremely good result. However, keep in mind that even losing 5 percent of your body weight leads to important health benefits, so try not to get discouraged if you're not able to lose more than this. Will recheck weight in 3-6 months.   Other orders -     Cholecalciferol (VITAMIN D3) 50 MCG (2000 UT) capsule; Take 1 capsule (2,000 Units total) by mouth daily.    Patient have been counseled extensively about nutrition and exercise. Other issues discussed during this visit include: low cholesterol diet, weight control and daily exercise, foot care, annual eye examinations at Ophthalmology, importance of adherence with medications and regular follow-up. We also discussed long term complications of uncontrolled diabetes and hypertension.   Return in about 3 months (around 02/22/2025).  The patient was given clear instructions to go to ER or return to medical center if symptoms don't improve, worsen or new problems develop. The patient verbalized understanding. The patient was told to call to get lab results if they haven't heard anything in the next week.   This note has been created with Education officer, environmental. Any transcriptional errors are unintentional.   Meagan SHAUNNA Bohr, NP 11/30/2024, 12:14 PM     [1]  Allergies Allergen Reactions   Tetracyclines & Related     Reaction: [id number]   Zithromax [Azithromycin] Swelling and Other (See Comments)    Reaction: SWELLING IN LIPS  [2]  Current Outpatient Medications on File Prior to Visit  Medication Sig Dispense Refill   celecoxib  (CELEBREX ) 100 MG capsule TAKE 1 CAPSULE BY MOUTH TWICE  DAILY 180 capsule 3   Multiple Vitamin (MULTIVITAMIN WITH MINERALS) TABS Take 1 tablet by mouth daily.     No current facility-administered medications on file prior to visit.   "
